# Patient Record
Sex: Female | Born: 1990 | Race: White | Hispanic: No | Marital: Married | Smoking: Former smoker
Health system: Southern US, Community
[De-identification: ages and names within clinical notes are randomized; demographics above are authoritative.]

## PROBLEM LIST (undated history)

## (undated) DIAGNOSIS — E039 Hypothyroidism, unspecified: Secondary | ICD-10-CM

## (undated) HISTORY — PX: WISDOM TOOTH EXTRACTION: SHX21

## (undated) HISTORY — PX: EYE SURGERY: SHX253

## (undated) HISTORY — PX: FRACTURE SURGERY: SHX138

---
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
## 2008-12-16 HISTORY — PX: WISDOM TOOTH EXTRACTION: SHX21

## 2009-07-09 ENCOUNTER — Emergency Department (HOSPITAL_COMMUNITY): Admission: EM | Admit: 2009-07-09 | Discharge: 2009-07-09 | Payer: Self-pay | Admitting: Family Medicine

## 2010-12-16 HISTORY — PX: EYE SURGERY: SHX253

## 2015-10-02 ENCOUNTER — Encounter: Payer: Self-pay | Admitting: Physician Assistant

## 2015-10-02 ENCOUNTER — Ambulatory Visit: Payer: Self-pay | Admitting: Physician Assistant

## 2015-10-02 VITALS — BP 132/65 | Temp 98.6°F | Wt 135.0 lb

## 2015-10-02 DIAGNOSIS — R21 Rash and other nonspecific skin eruption: Secondary | ICD-10-CM

## 2015-10-02 MED ORDER — CLOTRIMAZOLE-BETAMETHASONE 1-0.05 % EX CREA: TOPICAL_CREAM | CUTANEOUS | Status: DC

## 2015-10-02 MED ORDER — HYDROXYZINE HCL 50 MG PO TABS: 50.0000 mg | ORAL_TABLET | Freq: Three times a day (TID) | ORAL | Status: DC | PRN

## 2015-10-02 NOTE — Progress Notes (Signed)
  Rash  Subjective:    Patient ID: Alyssa Moore, female    DOB: Jan 10, 1991, 24 y.o.   MRN: [id number]  HPI Patient c/o rash bilateral axillary area for one day. Recent history of contact dermatitis to right arm from suspected sumac. Rash on arm resolved last week.  Patient denies any new hygiene products. States mild itching.   Review of Systems Negative except for compliant.     Objective:   Physical Exam Erythematous macular lesion bilateral axillary area.       Assessment & Plan:  Contact Dermatitis. Lotrisone and Atarax.  Follow up one week if no improvement.

## 2016-01-17 DIAGNOSIS — R87610 Atypical squamous cells of undetermined significance on cytologic smear of cervix (ASC-US): Secondary | ICD-10-CM | POA: Diagnosis not present

## 2016-01-17 DIAGNOSIS — Z01419 Encounter for gynecological examination (general) (routine) without abnormal findings: Secondary | ICD-10-CM | POA: Diagnosis not present

## 2016-12-29 DIAGNOSIS — J101 Influenza due to other identified influenza virus with other respiratory manifestations: Secondary | ICD-10-CM | POA: Diagnosis not present

## 2017-01-29 DIAGNOSIS — Z87898 Personal history of other specified conditions: Secondary | ICD-10-CM | POA: Diagnosis not present

## 2017-01-29 DIAGNOSIS — Z01419 Encounter for gynecological examination (general) (routine) without abnormal findings: Secondary | ICD-10-CM | POA: Diagnosis not present

## 2017-02-06 ENCOUNTER — Ambulatory Visit: Payer: Self-pay | Admitting: Physician Assistant

## 2017-02-06 VITALS — BP 108/60 | HR 59 | Temp 98.4°F

## 2017-02-06 DIAGNOSIS — L259 Unspecified contact dermatitis, unspecified cause: Secondary | ICD-10-CM

## 2017-02-06 MED ORDER — DEXAMETHASONE SODIUM PHOSPHATE 10 MG/ML IJ SOLN
10.0000 mg | Freq: Once | INTRAMUSCULAR | Status: AC
  Administered 2017-02-06: 10 mg via INTRAMUSCULAR

## 2017-02-06 NOTE — Progress Notes (Signed)
Decadron given IM right glut. Patient waited 15 min without any adverse reaction

## 2017-02-06 NOTE — Progress Notes (Signed)
S: c/o itchy rash on face,  States she gets it from [place] dogs, [place], usually gets it on her arms but this time its on her face, using calamine lotion  O: vitals wnl, nad, lungs c t a, cv rrr, skin with small raised red areas some with streaks/blisters on face, no drainage, n/v intact  A: acute contact dermatitis  P: decadron 10 mg im

## 2017-12-16 DIAGNOSIS — E039 Hypothyroidism, unspecified: Secondary | ICD-10-CM

## 2017-12-16 HISTORY — DX: Hypothyroidism, unspecified: E03.9

## 2018-02-24 DIAGNOSIS — Z3169 Encounter for other general counseling and advice on procreation: Secondary | ICD-10-CM | POA: Diagnosis not present

## 2018-02-24 DIAGNOSIS — E538 Deficiency of other specified B group vitamins: Secondary | ICD-10-CM | POA: Diagnosis not present

## 2018-02-24 DIAGNOSIS — Z01419 Encounter for gynecological examination (general) (routine) without abnormal findings: Secondary | ICD-10-CM | POA: Diagnosis not present

## 2018-02-24 DIAGNOSIS — Z0000 Encounter for general adult medical examination without abnormal findings: Secondary | ICD-10-CM | POA: Diagnosis not present

## 2018-02-24 DIAGNOSIS — R5383 Other fatigue: Secondary | ICD-10-CM | POA: Diagnosis not present

## 2018-02-27 DIAGNOSIS — R7989 Other specified abnormal findings of blood chemistry: Secondary | ICD-10-CM | POA: Insufficient documentation

## 2018-03-13 DIAGNOSIS — L578 Other skin changes due to chronic exposure to nonionizing radiation: Secondary | ICD-10-CM | POA: Diagnosis not present

## 2018-03-13 DIAGNOSIS — D485 Neoplasm of uncertain behavior of skin: Secondary | ICD-10-CM | POA: Diagnosis not present

## 2018-03-17 DIAGNOSIS — N912 Amenorrhea, unspecified: Secondary | ICD-10-CM | POA: Diagnosis not present

## 2018-03-17 DIAGNOSIS — E039 Hypothyroidism, unspecified: Secondary | ICD-10-CM | POA: Diagnosis not present

## 2018-04-01 DIAGNOSIS — E039 Hypothyroidism, unspecified: Secondary | ICD-10-CM | POA: Diagnosis not present

## 2018-04-07 DIAGNOSIS — D485 Neoplasm of uncertain behavior of skin: Secondary | ICD-10-CM | POA: Diagnosis not present

## 2018-04-07 DIAGNOSIS — D225 Melanocytic nevi of trunk: Secondary | ICD-10-CM | POA: Diagnosis not present

## 2018-04-14 DIAGNOSIS — Z3401 Encounter for supervision of normal first pregnancy, first trimester: Secondary | ICD-10-CM | POA: Diagnosis not present

## 2018-04-14 LAB — OB RESULTS CONSOLE VARICELLA ZOSTER ANTIBODY, IGG: Varicella: IMMUNE

## 2018-04-14 LAB — OB RESULTS CONSOLE HIV ANTIBODY (ROUTINE TESTING): HIV: NONREACTIVE

## 2018-04-14 LAB — OB RESULTS CONSOLE GC/CHLAMYDIA
Chlamydia: NEGATIVE
Gonorrhea: NEGATIVE

## 2018-04-14 LAB — OB RESULTS CONSOLE RUBELLA ANTIBODY, IGM: Rubella: IMMUNE

## 2018-04-14 LAB — OB RESULTS CONSOLE HEPATITIS B SURFACE ANTIGEN: Hepatitis B Surface Ag: NEGATIVE

## 2018-04-27 ENCOUNTER — Ambulatory Visit: Payer: Self-pay

## 2018-04-27 ENCOUNTER — Other Ambulatory Visit: Payer: Self-pay | Admitting: Certified Nurse Midwife

## 2018-04-27 DIAGNOSIS — Z369 Encounter for antenatal screening, unspecified: Secondary | ICD-10-CM

## 2018-04-30 ENCOUNTER — Other Ambulatory Visit: Payer: Self-pay | Admitting: *Deleted

## 2018-04-30 ENCOUNTER — Ambulatory Visit
Admission: RE | Admit: 2018-04-30 | Discharge: 2018-04-30 | Disposition: A | Discharge status: Home / Self Care | Payer: 59 | Source: Ambulatory Visit | Attending: Maternal & Fetal Medicine | Admitting: Maternal & Fetal Medicine

## 2018-04-30 ENCOUNTER — Encounter: Payer: Self-pay | Admitting: *Deleted

## 2018-04-30 ENCOUNTER — Ambulatory Visit
Admission: RE | Admit: 2018-04-30 | Discharge: 2018-04-30 | Disposition: A | Discharge status: Home / Self Care | Payer: 59 | Source: Ambulatory Visit | Attending: Certified Nurse Midwife | Admitting: Certified Nurse Midwife

## 2018-04-30 DIAGNOSIS — Z3682 Encounter for antenatal screening for nuchal translucency: Secondary | ICD-10-CM | POA: Diagnosis not present

## 2018-04-30 DIAGNOSIS — Z36 Encounter for antenatal screening for chromosomal anomalies: Secondary | ICD-10-CM | POA: Diagnosis not present

## 2018-04-30 DIAGNOSIS — Z369 Encounter for antenatal screening, unspecified: Secondary | ICD-10-CM

## 2018-04-30 HISTORY — DX: Hypothyroidism, unspecified: E03.9

## 2018-05-04 ENCOUNTER — Telehealth: Payer: Self-pay | Admitting: Obstetrics and Gynecology

## 2018-05-04 NOTE — Telephone Encounter (Signed)
  Ms. Cohea elected to undergo First Trimester screening on 04/30/2018.  To review, first trimester screening, includes nuchal translucency ultrasound screen and/or first trimester maternal serum marker screening.  The nuchal translucency has approximately an 80% detection rate for Down syndrome and can be positive for other chromosome abnormalities as well as heart defects.  When combined with a maternal serum marker screening, the detection rate is up to 90% for Down syndrome and up to 97% for trisomy 13 and 18.     The results of the First Trimester Nuchal Translucency and Biochemical Screening were within normal range.  The risk for Down syndrome is now estimated to be less than 1 in 10,000.  The risk for Trisomy 13/18 is also estimated to be less than 1 in 10,000.  Should more definitive information be desired, we would offer amniocentesis.  Because we do not yet know the effectiveness of combined first and second trimester screening, we do not recommend a maternal serum screen to assess the chance for chromosome conditions.  However, if screening for neural tube defects is desired, maternal serum screening for AFP only can be performed between 15 and [redacted] weeks gestation.    The patient declined CF and SMA carrier screening at her visit with [place].  Wilburt Finlay, MS, CGC

## 2018-05-05 DIAGNOSIS — E039 Hypothyroidism, unspecified: Secondary | ICD-10-CM | POA: Diagnosis not present

## 2018-05-26 ENCOUNTER — Other Ambulatory Visit: Payer: Self-pay | Admitting: Obstetrics and Gynecology

## 2018-05-26 DIAGNOSIS — Z3402 Encounter for supervision of normal first pregnancy, second trimester: Secondary | ICD-10-CM

## 2018-06-02 DIAGNOSIS — E039 Hypothyroidism, unspecified: Secondary | ICD-10-CM | POA: Diagnosis not present

## 2018-06-02 DIAGNOSIS — O99282 Endocrine, nutritional and metabolic diseases complicating pregnancy, second trimester: Secondary | ICD-10-CM | POA: Diagnosis not present

## 2018-06-02 DIAGNOSIS — E079 Disorder of thyroid, unspecified: Secondary | ICD-10-CM | POA: Diagnosis not present

## 2018-06-09 DIAGNOSIS — E039 Hypothyroidism, unspecified: Secondary | ICD-10-CM | POA: Insufficient documentation

## 2018-06-19 ENCOUNTER — Ambulatory Visit
Admission: RE | Admit: 2018-06-19 | Discharge: 2018-06-19 | Disposition: A | Discharge status: Home / Self Care | Payer: 59 | Source: Ambulatory Visit | Attending: Obstetrics and Gynecology | Admitting: Obstetrics and Gynecology

## 2018-06-19 DIAGNOSIS — Z3402 Encounter for supervision of normal first pregnancy, second trimester: Secondary | ICD-10-CM | POA: Diagnosis not present

## 2018-06-19 DIAGNOSIS — O358XX0 Maternal care for other (suspected) fetal abnormality and damage, not applicable or unspecified: Secondary | ICD-10-CM | POA: Diagnosis not present

## 2018-06-19 DIAGNOSIS — Z3A19 19 weeks gestation of pregnancy: Secondary | ICD-10-CM | POA: Diagnosis not present

## 2018-07-01 DIAGNOSIS — O99282 Endocrine, nutritional and metabolic diseases complicating pregnancy, second trimester: Secondary | ICD-10-CM | POA: Diagnosis not present

## 2018-07-01 DIAGNOSIS — E079 Disorder of thyroid, unspecified: Secondary | ICD-10-CM | POA: Diagnosis not present

## 2018-07-01 DIAGNOSIS — E039 Hypothyroidism, unspecified: Secondary | ICD-10-CM | POA: Diagnosis not present

## 2018-08-06 DIAGNOSIS — E079 Disorder of thyroid, unspecified: Secondary | ICD-10-CM | POA: Diagnosis not present

## 2018-08-06 DIAGNOSIS — O99282 Endocrine, nutritional and metabolic diseases complicating pregnancy, second trimester: Secondary | ICD-10-CM | POA: Diagnosis not present

## 2018-08-11 DIAGNOSIS — E039 Hypothyroidism, unspecified: Secondary | ICD-10-CM | POA: Diagnosis not present

## 2018-08-11 DIAGNOSIS — O99282 Endocrine, nutritional and metabolic diseases complicating pregnancy, second trimester: Secondary | ICD-10-CM | POA: Diagnosis not present

## 2018-08-11 DIAGNOSIS — E079 Disorder of thyroid, unspecified: Secondary | ICD-10-CM | POA: Diagnosis not present

## 2018-08-18 DIAGNOSIS — R030 Elevated blood-pressure reading, without diagnosis of hypertension: Secondary | ICD-10-CM | POA: Diagnosis not present

## 2018-08-18 DIAGNOSIS — O0992 Supervision of high risk pregnancy, unspecified, second trimester: Secondary | ICD-10-CM | POA: Diagnosis not present

## 2018-09-10 DIAGNOSIS — O99282 Endocrine, nutritional and metabolic diseases complicating pregnancy, second trimester: Secondary | ICD-10-CM | POA: Diagnosis not present

## 2018-09-10 DIAGNOSIS — E079 Disorder of thyroid, unspecified: Secondary | ICD-10-CM | POA: Diagnosis not present

## 2018-09-22 DIAGNOSIS — Z23 Encounter for immunization: Secondary | ICD-10-CM | POA: Diagnosis not present

## 2018-10-05 DIAGNOSIS — Z3483 Encounter for supervision of other normal pregnancy, third trimester: Secondary | ICD-10-CM | POA: Diagnosis not present

## 2018-10-05 DIAGNOSIS — Z3482 Encounter for supervision of other normal pregnancy, second trimester: Secondary | ICD-10-CM | POA: Diagnosis not present

## 2018-10-08 DIAGNOSIS — E079 Disorder of thyroid, unspecified: Secondary | ICD-10-CM | POA: Diagnosis not present

## 2018-10-08 DIAGNOSIS — O99282 Endocrine, nutritional and metabolic diseases complicating pregnancy, second trimester: Secondary | ICD-10-CM | POA: Diagnosis not present

## 2018-10-13 IMAGING — US US OB COMP +14 WK
1 series · 14 of 28 positions shown · non-contrast
Comparison: none

CLINICAL DATA: Normal pregnancy.  Fetal anatomy evaluation.

EXAM:
OBSTETRICAL ULTRASOUND >14 WKS

[Series 1: us ob comp +14 wk · 14 of 77 slices shown]
[im 3/77]
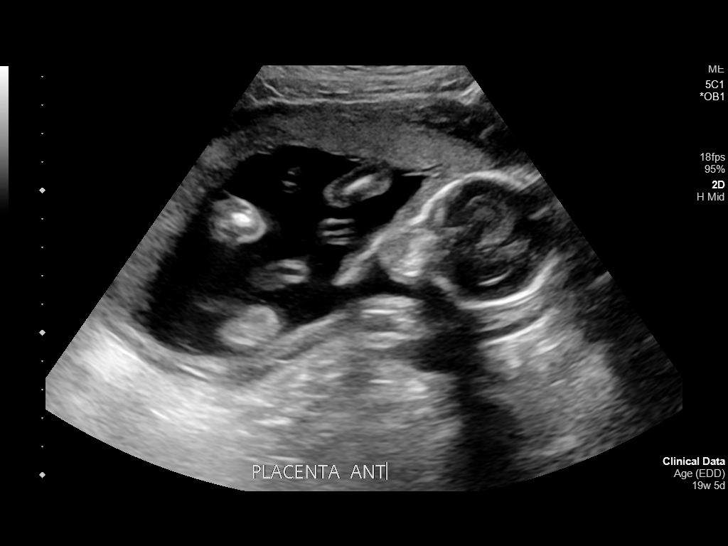
[im 9/77]
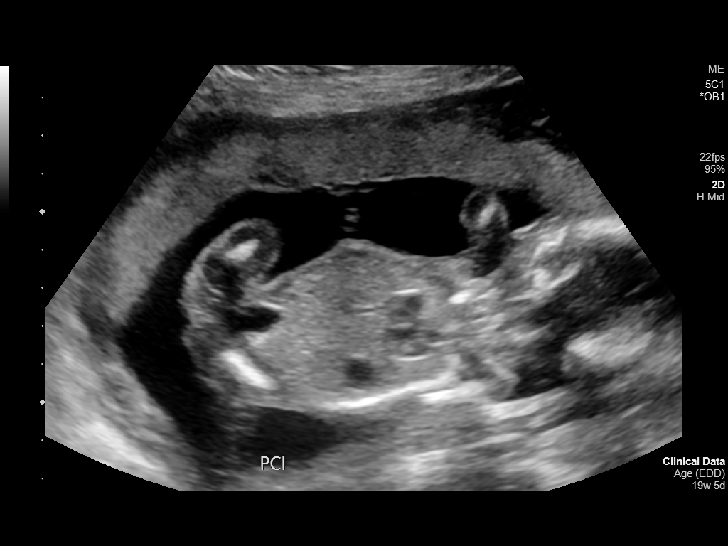
[im 15/77]
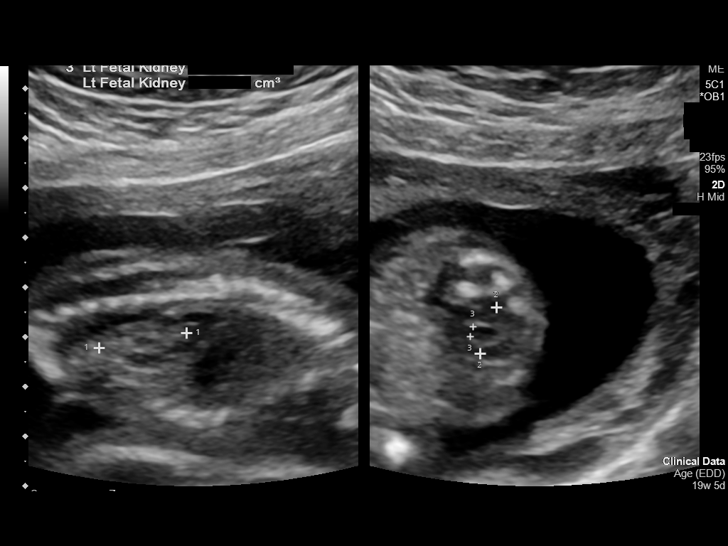
[im 20/77]
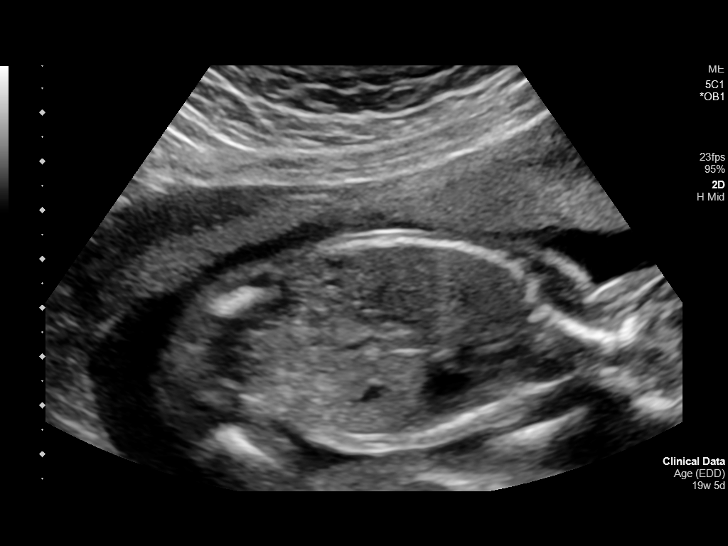
[im 26/77]
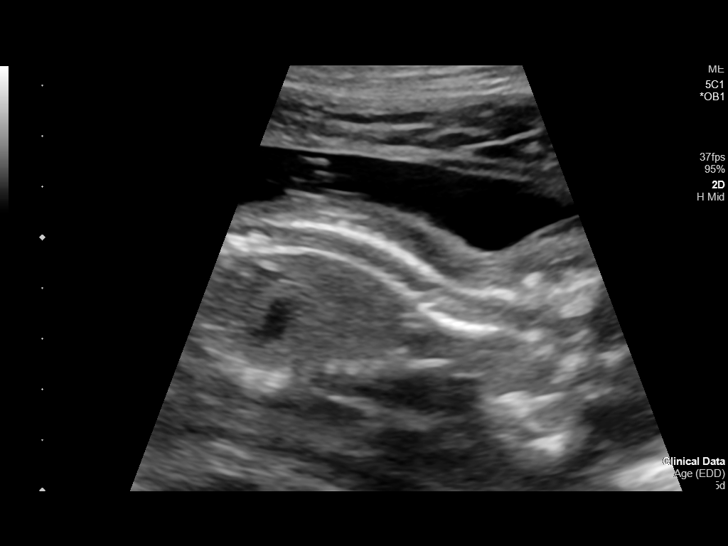
[im 31/77]
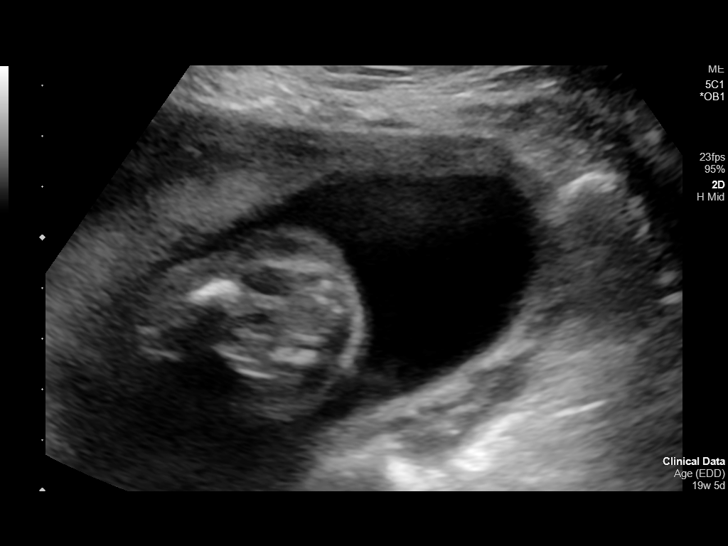
[im 37/77]
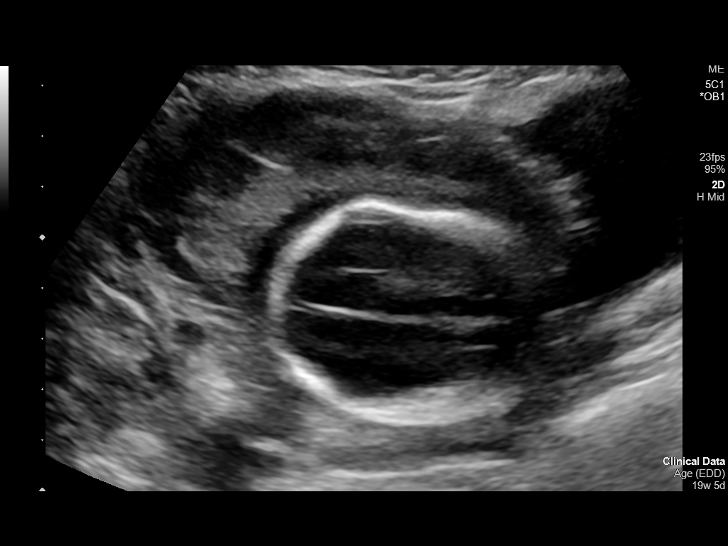
[im 43/77]
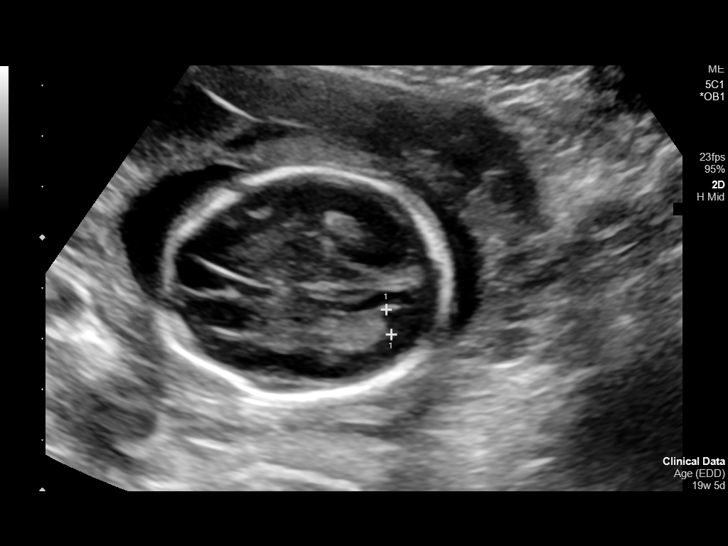
[im 48/77]
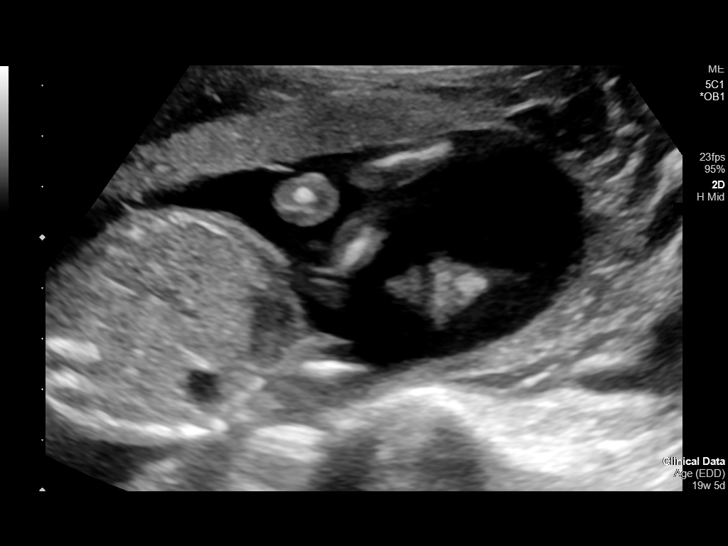
[im 54/77]
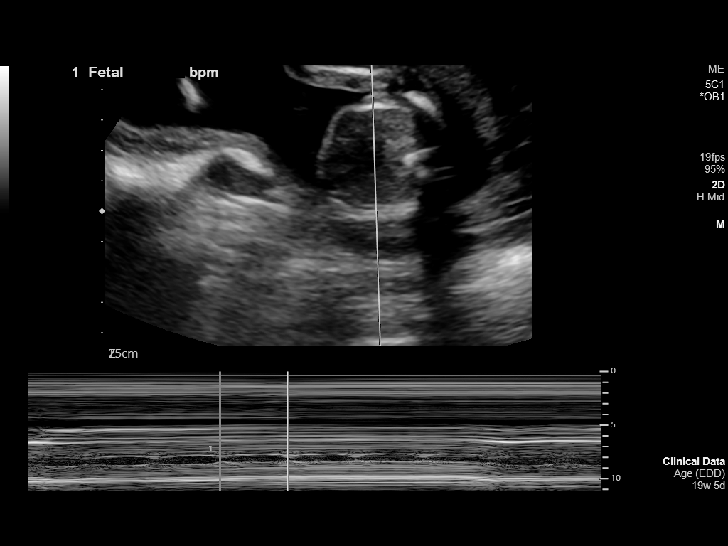
[im 60/77]
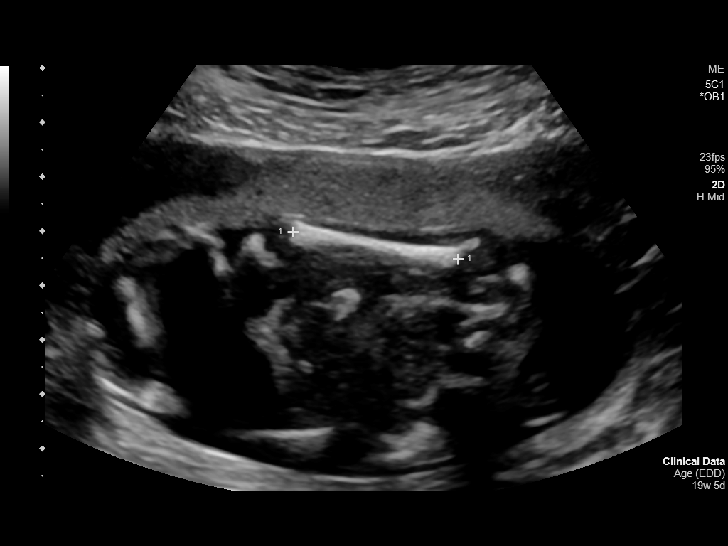
[im 65/77]
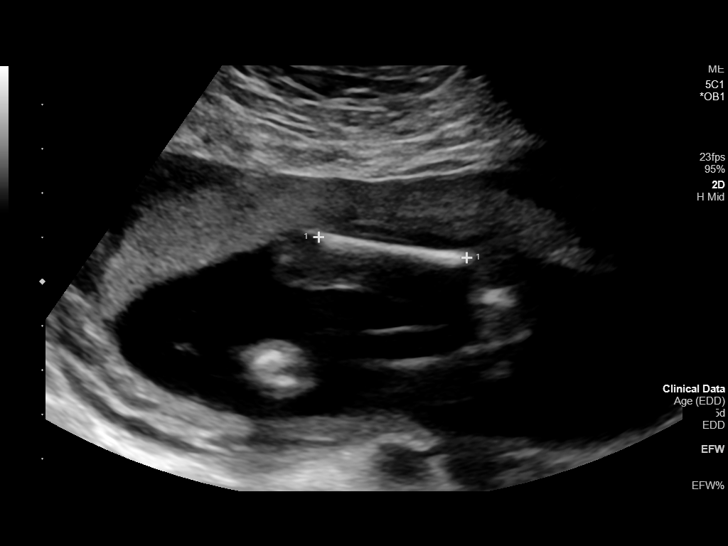
[im 71/77]
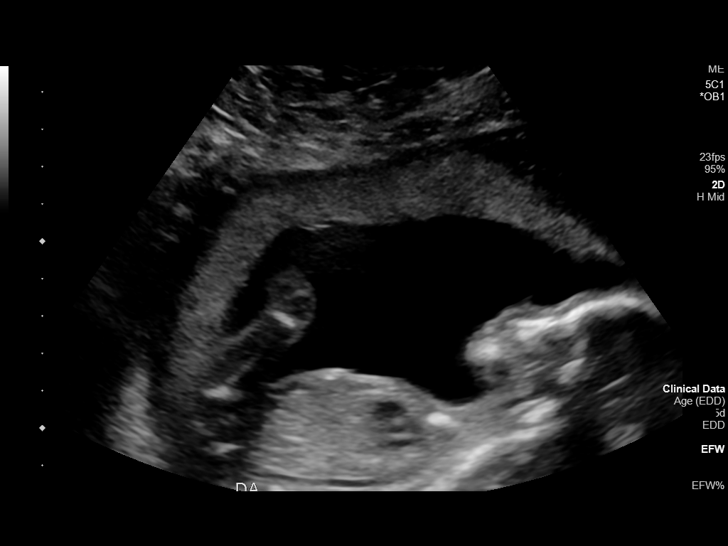
[im 77/77]
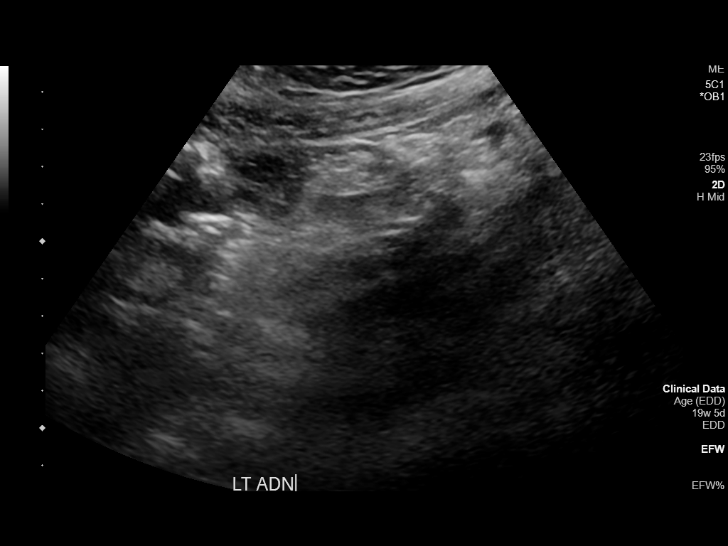

[14 of 28 positions shown; findings below may reference images not displayed]

FINDINGS: Number of Fetuses: 1

Heart Rate:  152 bpm

Movement: Yes

Presentation: Cephalic

Previa: No

Placental Location: Anterior

Amniotic Fluid (Subjective): Normal

Amniotic Fluid (Objective):

Vertical pocket 3.9cm

FETAL BIOMETRY

BPD: 4.46cm 19w 3d

HC: 16.34cm  19w   1d

AC: 14.63cm  19w   6d

FL: 3.32cm  20w   3d

Current Mean GA: 19w 5d              US EDC: 11/08/2018

FETAL ANATOMY

Lateral Ventricles: Appears normal

Thalami/CSP: Appears normal

Posterior Fossa:  Appears normal

Nuchal Region: Appears normal

Upper Lip: Appears normal

Spine: Appears normal

4 Chamber Heart on Left: Appears normal

LVOT: Appears normal

RVOT: Appears normal

Stomach on Left: Appears normal

3 Vessel Cord: Appears normal

Cord Insertion site: Appears normal

Kidneys: Appears normal

Bladder: Appears normal

Extremities: Appears normal

Technically difficult due to: None

Maternal Findings:

Cervix:  Cervix is normal in length and closed measuring 3.8 cm.
IMPRESSION: Single live intrauterine pregnancy as detailed above.

## 2018-10-14 DIAGNOSIS — O0993 Supervision of high risk pregnancy, unspecified, third trimester: Secondary | ICD-10-CM | POA: Diagnosis not present

## 2018-10-14 LAB — OB RESULTS CONSOLE GBS: GBS: NEGATIVE

## 2018-10-16 DIAGNOSIS — E039 Hypothyroidism, unspecified: Secondary | ICD-10-CM | POA: Diagnosis not present

## 2018-10-16 DIAGNOSIS — O99283 Endocrine, nutritional and metabolic diseases complicating pregnancy, third trimester: Secondary | ICD-10-CM | POA: Diagnosis not present

## 2018-10-19 ENCOUNTER — Observation Stay
Admission: EM | Admit: 2018-10-19 | Discharge: 2018-10-19 | Disposition: A | Discharge status: Home / Self Care | Payer: 59 | Attending: Obstetrics & Gynecology | Admitting: Obstetrics & Gynecology

## 2018-10-19 ENCOUNTER — Other Ambulatory Visit: Payer: Self-pay | Admitting: Obstetrics & Gynecology

## 2018-10-19 ENCOUNTER — Other Ambulatory Visit: Payer: Self-pay

## 2018-10-19 DIAGNOSIS — O321XX0 Maternal care for breech presentation, not applicable or unspecified: Secondary | ICD-10-CM | POA: Diagnosis not present

## 2018-10-19 DIAGNOSIS — Z79890 Hormone replacement therapy (postmenopausal): Secondary | ICD-10-CM | POA: Diagnosis not present

## 2018-10-19 DIAGNOSIS — O99283 Endocrine, nutritional and metabolic diseases complicating pregnancy, third trimester: Secondary | ICD-10-CM | POA: Diagnosis not present

## 2018-10-19 DIAGNOSIS — Z3A37 37 weeks gestation of pregnancy: Secondary | ICD-10-CM | POA: Diagnosis not present

## 2018-10-19 DIAGNOSIS — Z87891 Personal history of nicotine dependence: Secondary | ICD-10-CM | POA: Diagnosis not present

## 2018-10-19 DIAGNOSIS — Z3493 Encounter for supervision of normal pregnancy, unspecified, third trimester: Secondary | ICD-10-CM

## 2018-10-19 DIAGNOSIS — O329XX0 Maternal care for malpresentation of fetus, unspecified, not applicable or unspecified: Secondary | ICD-10-CM | POA: Diagnosis present

## 2018-10-19 DIAGNOSIS — O368330 Maternal care for abnormalities of the fetal heart rate or rhythm, third trimester, not applicable or unspecified: Secondary | ICD-10-CM | POA: Diagnosis not present

## 2018-10-19 LAB — TYPE AND SCREEN
ABO/RH(D): O POS
Antibody Screen: NEGATIVE

## 2018-10-19 LAB — OB RESULTS CONSOLE GBS: GBS: NEGATIVE

## 2018-10-19 LAB — CBC
HCT: 35.3 % — ABNORMAL LOW (ref 36.0–46.0)
Hemoglobin: 11.6 g/dL — ABNORMAL LOW (ref 12.0–15.0)
MCH: 31.6 pg (ref 26.0–34.0)
MCHC: 32.9 g/dL (ref 30.0–36.0)
MCV: 96.2 fL (ref 80.0–100.0)
NRBC: 0 % (ref 0.0–0.2)
PLATELETS: 201 10*3/uL (ref 150–400)
RBC: 3.67 MIL/uL — AB (ref 3.87–5.11)
RDW: 13.2 % (ref 11.5–15.5)
WBC: 10.8 10*3/uL — ABNORMAL HIGH (ref 4.0–10.5)

## 2018-10-19 MED ORDER — LIDOCAINE HCL (PF) 1 % IJ SOLN: 30.0000 mL | INTRAMUSCULAR | Status: DC | PRN

## 2018-10-19 MED ORDER — TERBUTALINE SULFATE 1 MG/ML IJ SOLN
0.2500 mg | Freq: Once | INTRAMUSCULAR | Status: AC
  Administered 2018-10-19: 0.25 mg via SUBCUTANEOUS
  Filled 2018-10-19: qty 1

## 2018-10-19 MED ORDER — SOD CITRATE-CITRIC ACID 500-334 MG/5ML PO SOLN: 30.0000 mL | ORAL | Status: DC | PRN

## 2018-10-19 MED ORDER — LACTATED RINGERS IV SOLN
INTRAVENOUS | Status: DC
  Administered 2018-10-19: 10:00:00 via INTRAVENOUS

## 2018-10-19 MED ORDER — FENTANYL CITRATE (PF) 100 MCG/2ML IJ SOLN
50.0000 ug | INTRAMUSCULAR | Status: DC | PRN
  Administered 2018-10-19: 100 ug via INTRAVENOUS
  Filled 2018-10-19: qty 2

## 2018-10-19 MED ORDER — ONDANSETRON HCL 4 MG/2ML IJ SOLN: 4.0000 mg | Freq: Four times a day (QID) | INTRAMUSCULAR | Status: DC | PRN

## 2018-10-19 MED ORDER — LACTATED RINGERS IV SOLN: 500.0000 mL | INTRAVENOUS | Status: DC | PRN

## 2018-10-19 MED ORDER — ACETAMINOPHEN 500 MG PO TABS: 1000.0000 mg | ORAL_TABLET | Freq: Four times a day (QID) | ORAL | Status: DC | PRN

## 2018-10-19 MED ORDER — MINERAL OIL LIGHT 100 % EX OIL
TOPICAL_OIL | Freq: Once | CUTANEOUS | Status: AC
  Administered 2018-10-19: 1 via TOPICAL
  Filled 2018-10-19: qty 25

## 2018-10-19 NOTE — Discharge Summary (Signed)
Alyssa Moore is a 27 y.o. female. She is at [redacted]w[redacted]d gestation. Patient's last menstrual period was 02/01/2018 (exact date). Estimated Date of Delivery: 11/08/18  Prenatal care site: [facility]   Chief complaint: breech  S: Resting comfortably. no CTX, no VB.no LOF,  Active fetal movement.   Alyssa Moore is here for attempt at external cephalic version due to breech position of the fetus.   Maternal Medical History:   Past Medical History:  Diagnosis Date  . Hypothyroidism     Past Surgical History:  Procedure Laterality Date  . FRACTURE SURGERY      No Known Allergies  Prior to Admission medications   Medication Sig Start Date End Date Taking? Authorizing Provider  levothyroxine (SYNTHROID, LEVOTHROID) 88 MCG tablet Take 88 mcg by mouth daily before breakfast.   Yes [provider]  Prenatal Vit-Fe Fumarate-FA (PRENATAL MULTIVITAMIN) TABS tablet Take 1 tablet by mouth daily at 12 noon.   Yes [provider]  norgestimate-ethinyl estradiol (ORTHO-CYCLEN,SPRINTEC,PREVIFEM) 0.25-35 MG-MCG tablet Take 1 tablet by mouth daily.    [provider]     Social History: She  reports that she has quit smoking. She has never used smokeless tobacco. She reports that she drank alcohol. She reports that she does not use drugs.  Family History: no history of gyn cancers  Review of Systems: A full review of systems was performed and negative except as noted in the HPI.     O:  BP 121/82 (BP Location: Right Arm)   Pulse 85   Temp 98.1 F (36.7 C) (Oral)   Resp 18   Ht 5\' 3"  (1.6 m)   Wt 77.6 kg   LMP 02/01/2018 (Exact Date)   SpO2 100%   BMI 30.29 kg/m  Results for orders placed or performed during the hospital encounter of 10/19/18 (from the past 48 hour(s))  CBC   Collection Time: 10/19/18  9:38 AM  Result Value Ref Range   WBC 10.8 (H) 4.0 - 10.5 K/uL   RBC 3.67 (L) 3.87 - 5.11 MIL/uL   Hemoglobin 11.6 (L) 12.0 - 15.0 g/dL   HCT 35.3 (L)  36.0 - 46.0 %   MCV 96.2 80.0 - 100.0 fL   MCH 31.6 26.0 - 34.0 pg   MCHC 32.9 30.0 - 36.0 g/dL   RDW 13.2 11.5 - 15.5 %   Platelets 201 150 - 400 K/uL   nRBC 0.0 0.0 - 0.2 %  Type and screen   Collection Time: 10/19/18  9:38 AM  Result Value Ref Range   ABO/RH(D) O POS    Antibody Screen NEG    Sample Expiration      10/22/2018 Performed at [facility], [address] [place], [place] [address]      Constitutional: NAD, AAOx3  HE/ENT: extraocular movements grossly intact, moist mucous membranes CV: RRR PULM: nl respiratory effort, CTABL     Abd: gravid, non-tender, non-distended, soft      Ext: Non-tender, Nonedmeatous   Psych: mood appropriate, speech normal Pelvic deferred  NST:  Baseline: 140 Variability: moderate Accelerations present x >2 Decelerations absent Time 26mins  Ultrasound:   Fetal head midline with spine maternal left, coccyx at pelvic inlet.  Ample fluid. During version, fetal HR decelerated to 60s and then returned to baseline with observation.    Assessment: 27 y.o. [redacted]w[redacted]d here for antenatal surveillance during pregnancy.  Principle diagnosis: malpresentation of fetus, third trimester  Plan:  External cephalic version unsuccessful  Fetal Wellbeing:  Reassuring Cat 1 tracing.  Reactive NST   D/c home stable, precautions reviewed, follow-up as scheduled.   ----- Larey Days, MD Attending Obstetrician and Gynecologist [facility], Department of [facility]

## 2018-10-19 NOTE — Discharge Summary (Signed)
RN reviewed discharge instructions with patient. Gave patient opportunity for questions. All questions answered at this time. Pt verbalized understanding. Pt discharged home with her spouse and mother.

## 2018-10-19 NOTE — Progress Notes (Signed)
MDs at bedside performing version on patient currently. Will continue to monitor

## 2018-10-20 LAB — RPR: RPR Ser Ql: NONREACTIVE

## 2018-10-20 NOTE — Procedures (Signed)
Patient is 37+1 weeks with Breech presentation. She was counseled of attempt for external cephalic version vs. Cesarean.  Risks of failure, abruption, rupture of membranes, and fetal distress were discussed, and patient agreed for ECV.  Preop: breech presentation, term IUP Postop: breech presentation, failed ECV, term IUP  Findings:  Breech fetal lie, with fetal head maternal midline, spine maternal left, placenta maternal right Post-procedure: unchanged, normal cardiac rate and rhythm  External cephalic version attempted with mineral oil.22mcg of terbutaline given IM and60mcgof iv fentanylgivenat the beginningthe procedure. Fetal heart rate documented in the normal baseline range throughout procedure. Bedside ultrasound confirmedfetal head unchanged at the end of the procedure.  After the procedure, external fetal monitors were placed and confirmed a normal baseline heart rate with moderate variability, recovered after a brief period of bradycardia.   She will be scheduled for a cesarean on 11/03/18 at 39 weeks.  If labor progresses prior to this date, she will be delivered at that time.   ----- Larey Days, MD Attending Obstetrician and Gynecologist [facility], Department of [facility]

## 2018-11-02 ENCOUNTER — Other Ambulatory Visit: Payer: Self-pay

## 2018-11-02 ENCOUNTER — Encounter
Admission: RE | Admit: 2018-11-02 | Discharge: 2018-11-02 | Disposition: A | Discharge status: Home / Self Care | Payer: 59 | Source: Ambulatory Visit | Attending: Obstetrics & Gynecology | Admitting: Obstetrics & Gynecology

## 2018-11-02 DIAGNOSIS — Z01812 Encounter for preprocedural laboratory examination: Secondary | ICD-10-CM

## 2018-11-02 LAB — TYPE AND SCREEN
ABO/RH(D): O POS
Antibody Screen: NEGATIVE
EXTEND SAMPLE REASON: UNDETERMINED

## 2018-11-02 NOTE — Patient Instructions (Signed)
Your procedure is scheduled on: Tuesday November 03, 2018  Report to [place] AT 11:30AM   REMEMBER: Instructions that are not followed completely may result in serious medical risk, up to and including death; or upon the discretion of your surgeon and anesthesiologist your surgery may need to be rescheduled.  Do not eat food after midnight the night before surgery.  No gum chewing, lozengers or hard candies.  You may however, drink CLEAR liquids up to 2 hours before you are scheduled to arrive for your surgery. Do not drink anything within 2 hours of the start of your surgery.  Clear liquids include: - water  - apple juice without pulp - CLEAR gatorade - black coffee or tea (Do NOT add milk or creamers to the coffee or tea) Do NOT drink anything that is not on this list.  Type 1 and Type 2 diabetics should only drink water.  No Alcohol for 24 hours before or after surgery.  No Smoking including e-cigarettes for 24 hours prior to surgery.  No chewable tobacco products for at least 6 hours prior to surgery.  No nicotine patches on the day of surgery.  On the morning of surgery brush your teeth with toothpaste and water, you may rinse your mouth with mouthwash if you wish. Do not swallow any toothpaste or mouthwash.  Notify your doctor if there is any change in your medical condition (cold, fever, infection).  Do not wear jewelry, make-up, hairpins, clips or nail polish.  Do not wear lotions, powders, or perfumes.   Do not shave 48 hours prior to surgery.   Contacts and dentures may not be worn into surgery.  Do not bring valuables to the hospital, including drivers license, insurance or credit cards.   is not responsible for any belongings or valuables.   TAKE THESE MEDICATIONS THE MORNING OF SURGERY: SYNTHROID  Use CHG  wipes as directed on instruction sheet.  Stop Anti-inflammatories (NSAIDS) such as Advil, Aleve, Ibuprofen, Motrin, Naproxen,  Naprosyn and Aspirin based products such as Excedrin, Goodys Powder, BC Powder. (May take Tylenol or Acetaminophen if needed.)  Stop ANY OVER THE COUNTER supplements until after surgery. (May continue Vitamin D, Vitamin B, and multivitamin.)  Wear comfortable clothing (specific to your surgery type) to the hospital.  Plan for stool softeners for home use.  If you are being admitted to the hospital overnight, leave your suitcase in the car. After surgery it may be brought to your room.  If you are being discharged the day of surgery, you will not be allowed to drive home. You will need a responsible adult to drive you home and stay with you that night.   If you are taking public transportation, you will need to have a responsible adult with you. Please confirm with your physician that it is acceptable to use public transportation.   Please call [phone] if you have any questions about these instructions.

## 2018-11-03 ENCOUNTER — Encounter
Admission: EM | Disposition: A | Discharge status: Home / Self Care | Payer: Self-pay | Source: Home / Self Care | Attending: Obstetrics & Gynecology

## 2018-11-03 ENCOUNTER — Other Ambulatory Visit: Payer: Self-pay

## 2018-11-03 ENCOUNTER — Inpatient Hospital Stay: Payer: 59 | Admitting: Anesthesiology

## 2018-11-03 ENCOUNTER — Inpatient Hospital Stay
Admission: EM | Admit: 2018-11-03 | Discharge: 2018-11-05 | DRG: 787 | Disposition: A | Discharge status: Home / Self Care | Payer: 59 | Attending: Obstetrics & Gynecology | Admitting: Obstetrics & Gynecology

## 2018-11-03 ENCOUNTER — Inpatient Hospital Stay: Admission: RE | Admit: 2018-11-03 | Payer: 59 | Source: Ambulatory Visit | Admitting: Obstetrics & Gynecology

## 2018-11-03 DIAGNOSIS — E039 Hypothyroidism, unspecified: Secondary | ICD-10-CM | POA: Diagnosis present

## 2018-11-03 DIAGNOSIS — O329XX0 Maternal care for malpresentation of fetus, unspecified, not applicable or unspecified: Secondary | ICD-10-CM | POA: Diagnosis present

## 2018-11-03 DIAGNOSIS — O99284 Endocrine, nutritional and metabolic diseases complicating childbirth: Secondary | ICD-10-CM | POA: Diagnosis present

## 2018-11-03 DIAGNOSIS — O321XX0 Maternal care for breech presentation, not applicable or unspecified: Secondary | ICD-10-CM | POA: Diagnosis not present

## 2018-11-03 DIAGNOSIS — Z3A39 39 weeks gestation of pregnancy: Secondary | ICD-10-CM | POA: Diagnosis not present

## 2018-11-03 DIAGNOSIS — Z87891 Personal history of nicotine dependence: Secondary | ICD-10-CM | POA: Diagnosis not present

## 2018-11-03 DIAGNOSIS — O9081 Anemia of the puerperium: Secondary | ICD-10-CM | POA: Diagnosis not present

## 2018-11-03 DIAGNOSIS — D62 Acute posthemorrhagic anemia: Secondary | ICD-10-CM | POA: Diagnosis not present

## 2018-11-03 LAB — CBC
HCT: 34.8 % — ABNORMAL LOW (ref 36.0–46.0)
HEMOGLOBIN: 11.6 g/dL — AB (ref 12.0–15.0)
MCH: 32 pg (ref 26.0–34.0)
MCHC: 33.3 g/dL (ref 30.0–36.0)
MCV: 96.1 fL (ref 80.0–100.0)
Platelets: 209 10*3/uL (ref 150–400)
RBC: 3.62 MIL/uL — AB (ref 3.87–5.11)
RDW: 13.5 % (ref 11.5–15.5)
WBC: 10.8 10*3/uL — AB (ref 4.0–10.5)
nRBC: 0 % (ref 0.0–0.2)

## 2018-11-03 SURGERY — Surgical Case
Anesthesia: Spinal

## 2018-11-03 MED ORDER — DIPHENHYDRAMINE HCL 25 MG PO CAPS: 25.0000 mg | ORAL_CAPSULE | ORAL | Status: DC | PRN

## 2018-11-03 MED ORDER — SODIUM CHLORIDE 0.9% FLUSH: 3.0000 mL | Freq: Two times a day (BID) | INTRAVENOUS | Status: DC

## 2018-11-03 MED ORDER — NALBUPHINE HCL 10 MG/ML IJ SOLN: 5.0000 mg | INTRAMUSCULAR | Status: DC | PRN

## 2018-11-03 MED ORDER — PRENATAL MULTIVITAMIN CH
1.0000 | ORAL_TABLET | Freq: Every day | ORAL | Status: DC
  Administered 2018-11-04: 1 via ORAL
  Filled 2018-11-03: qty 1

## 2018-11-03 MED ORDER — LEVOTHYROXINE SODIUM 88 MCG PO TABS
88.0000 ug | ORAL_TABLET | ORAL | Status: DC
  Administered 2018-11-04 – 2018-11-05 (×2): 88 ug via ORAL
  Filled 2018-11-03: qty 1
  Filled 2018-11-03: qty 2

## 2018-11-03 MED ORDER — BUPIVACAINE IN DEXTROSE 0.75-8.25 % IT SOLN
INTRATHECAL | Status: DC | PRN
  Administered 2018-11-03: 1.6 mL via INTRATHECAL

## 2018-11-03 MED ORDER — OXYTOCIN 40 UNITS IN LACTATED RINGERS INFUSION - SIMPLE MED
INTRAVENOUS | Status: AC
  Filled 2018-11-03: qty 1000

## 2018-11-03 MED ORDER — SODIUM CHLORIDE 0.9 % IV SOLN: 250.0000 mL | INTRAVENOUS | Status: DC

## 2018-11-03 MED ORDER — SIMETHICONE 80 MG PO CHEW
160.0000 mg | CHEWABLE_TABLET | Freq: Four times a day (QID) | ORAL | Status: DC | PRN
  Administered 2018-11-04 – 2018-11-05 (×5): 160 mg via ORAL
  Filled 2018-11-03 (×6): qty 2

## 2018-11-03 MED ORDER — SODIUM CHLORIDE 0.9% FLUSH: 3.0000 mL | INTRAVENOUS | Status: DC | PRN

## 2018-11-03 MED ORDER — DIBUCAINE 1 % RE OINT: 1.0000 "application " | TOPICAL_OINTMENT | RECTAL | Status: DC | PRN

## 2018-11-03 MED ORDER — OXYTOCIN 40 UNITS IN LACTATED RINGERS INFUSION - SIMPLE MED
INTRAVENOUS | Status: DC | PRN
  Administered 2018-11-03: 600 mL via INTRAVENOUS
  Administered 2018-11-03: 62.5 mL/h

## 2018-11-03 MED ORDER — NALBUPHINE HCL 10 MG/ML IJ SOLN: 5.0000 mg | Freq: Once | INTRAMUSCULAR | Status: DC | PRN

## 2018-11-03 MED ORDER — FENTANYL CITRATE (PF) 100 MCG/2ML IJ SOLN
INTRAMUSCULAR | Status: DC | PRN
  Administered 2018-11-03: 15 ug via INTRATHECAL

## 2018-11-03 MED ORDER — FAMOTIDINE 20 MG PO TABS
20.0000 mg | ORAL_TABLET | Freq: Once | ORAL | Status: AC
  Administered 2018-11-03: 20 mg via ORAL
  Filled 2018-11-03: qty 1

## 2018-11-03 MED ORDER — WITCH HAZEL-GLYCERIN EX PADS: 1.0000 "application " | MEDICATED_PAD | CUTANEOUS | Status: DC | PRN

## 2018-11-03 MED ORDER — COCONUT OIL OIL
1.0000 "application " | TOPICAL_OIL | Status: DC | PRN
  Administered 2018-11-03: 1 via TOPICAL
  Filled 2018-11-03: qty 120

## 2018-11-03 MED ORDER — ONDANSETRON HCL 4 MG/2ML IJ SOLN
INTRAMUSCULAR | Status: DC | PRN
  Administered 2018-11-03: 4 mg via INTRAVENOUS

## 2018-11-03 MED ORDER — LACTATED RINGERS IV SOLN
INTRAVENOUS | Status: DC
  Administered 2018-11-03: 1000 mL via INTRAVENOUS
  Administered 2018-11-03: 13:00:00 via INTRAVENOUS

## 2018-11-03 MED ORDER — NALOXONE HCL 0.4 MG/ML IJ SOLN: 0.4000 mg | INTRAMUSCULAR | Status: DC | PRN

## 2018-11-03 MED ORDER — BUPIVACAINE HCL (PF) 0.5 % IJ SOLN
30.0000 mL | Freq: Once | INTRAMUSCULAR | Status: DC
  Filled 2018-11-03: qty 30

## 2018-11-03 MED ORDER — CEFAZOLIN SODIUM-DEXTROSE 2-4 GM/100ML-% IV SOLN
2.0000 g | INTRAVENOUS | Status: AC
  Administered 2018-11-03: 2 g via INTRAVENOUS
  Filled 2018-11-03: qty 100

## 2018-11-03 MED ORDER — KETOROLAC TROMETHAMINE 30 MG/ML IJ SOLN: 30.0000 mg | Freq: Four times a day (QID) | INTRAMUSCULAR | Status: DC

## 2018-11-03 MED ORDER — MENTHOL 3 MG MT LOZG
1.0000 | LOZENGE | OROMUCOSAL | Status: DC | PRN
  Filled 2018-11-03: qty 9

## 2018-11-03 MED ORDER — SODIUM CHLORIDE (PF) 0.9 % IJ SOLN
INTRAMUSCULAR | Status: AC
  Filled 2018-11-03: qty 50

## 2018-11-03 MED ORDER — KETOROLAC TROMETHAMINE 30 MG/ML IJ SOLN
30.0000 mg | Freq: Once | INTRAMUSCULAR | Status: AC
  Administered 2018-11-03: 30 mg via INTRAVENOUS
  Filled 2018-11-03: qty 1

## 2018-11-03 MED ORDER — ACETAMINOPHEN 325 MG PO TABS: 650.0000 mg | ORAL_TABLET | Freq: Four times a day (QID) | ORAL | Status: DC

## 2018-11-03 MED ORDER — MORPHINE SULFATE (PF) 0.5 MG/ML IJ SOLN
INTRAMUSCULAR | Status: DC | PRN
  Administered 2018-11-03: .1 mg via INTRATHECAL

## 2018-11-03 MED ORDER — OXYCODONE HCL 5 MG PO TABS: 10.0000 mg | ORAL_TABLET | ORAL | Status: DC | PRN

## 2018-11-03 MED ORDER — OXYCODONE HCL 5 MG PO TABS
5.0000 mg | ORAL_TABLET | ORAL | Status: DC | PRN
  Administered 2018-11-04 (×2): 5 mg via ORAL
  Filled 2018-11-03 (×2): qty 1

## 2018-11-03 MED ORDER — BUPIVACAINE LIPOSOME 1.3 % IJ SUSP
20.0000 mL | Freq: Once | INTRAMUSCULAR | Status: DC
  Filled 2018-11-03: qty 20

## 2018-11-03 MED ORDER — SENNOSIDES-DOCUSATE SODIUM 8.6-50 MG PO TABS
2.0000 | ORAL_TABLET | ORAL | Status: DC
  Administered 2018-11-04 – 2018-11-05 (×2): 2 via ORAL
  Filled 2018-11-03 (×3): qty 2

## 2018-11-03 MED ORDER — ACETAMINOPHEN 500 MG PO TABS
1000.0000 mg | ORAL_TABLET | Freq: Four times a day (QID) | ORAL | Status: DC
  Administered 2018-11-03 – 2018-11-05 (×7): 1000 mg via ORAL
  Filled 2018-11-03 (×7): qty 2

## 2018-11-03 MED ORDER — LACTATED RINGERS IV BOLUS
1000.0000 mL | Freq: Once | INTRAVENOUS | Status: AC
  Administered 2018-11-03: 1000 mL via INTRAVENOUS

## 2018-11-03 MED ORDER — IBUPROFEN 600 MG PO TABS
600.0000 mg | ORAL_TABLET | Freq: Four times a day (QID) | ORAL | Status: DC
  Administered 2018-11-04 – 2018-11-05 (×6): 600 mg via ORAL
  Filled 2018-11-03 (×6): qty 1

## 2018-11-03 MED ORDER — SOD CITRATE-CITRIC ACID 500-334 MG/5ML PO SOLN
30.0000 mL | ORAL | Status: AC
  Administered 2018-11-03: 30 mL via ORAL
  Filled 2018-11-03: qty 15

## 2018-11-03 MED ORDER — DIPHENHYDRAMINE HCL 25 MG PO CAPS: 25.0000 mg | ORAL_CAPSULE | Freq: Four times a day (QID) | ORAL | Status: DC | PRN

## 2018-11-03 MED ORDER — BUPIVACAINE LIPOSOME 1.3 % IJ SUSP
INTRAMUSCULAR | Status: DC | PRN
  Administered 2018-11-03: 50 mL

## 2018-11-03 MED ORDER — OXYCODONE HCL 5 MG PO TABS: 5.0000 mg | ORAL_TABLET | ORAL | Status: DC | PRN

## 2018-11-03 MED ORDER — ACETAMINOPHEN 650 MG RE SUPP
650.0000 mg | Freq: Once | RECTAL | Status: DC
  Filled 2018-11-03: qty 1

## 2018-11-03 MED ORDER — LACTATED RINGERS IV SOLN: INTRAVENOUS | Status: DC

## 2018-11-03 MED ORDER — OXYTOCIN 40 UNITS IN LACTATED RINGERS INFUSION - SIMPLE MED
2.5000 [IU]/h | INTRAVENOUS | Status: DC
  Administered 2018-11-03: 2.5 [IU]/h via INTRAVENOUS
  Filled 2018-11-03: qty 1000

## 2018-11-03 MED ORDER — SODIUM CHLORIDE (PF) 0.9 % IJ SOLN
INTRAMUSCULAR | Status: DC | PRN
  Administered 2018-11-03: 50 mL via INTRAVENOUS

## 2018-11-03 MED ORDER — DIPHENHYDRAMINE HCL 50 MG/ML IJ SOLN: 12.5000 mg | INTRAMUSCULAR | Status: DC | PRN

## 2018-11-03 MED ORDER — MEPERIDINE HCL 50 MG/ML IJ SOLN: 6.2500 mg | INTRAMUSCULAR | Status: DC | PRN

## 2018-11-03 MED ORDER — SODIUM CHLORIDE 0.9 % IV SOLN
INTRAVENOUS | Status: DC | PRN
  Administered 2018-11-03: 50 ug/min via INTRAVENOUS

## 2018-11-03 MED ORDER — ONDANSETRON HCL 4 MG/2ML IJ SOLN
4.0000 mg | Freq: Three times a day (TID) | INTRAMUSCULAR | Status: DC | PRN
  Filled 2018-11-03: qty 2

## 2018-11-03 SURGICAL SUPPLY — 30 items
CANISTER SUCT 3000ML PPV (MISCELLANEOUS) ×2 IMPLANT
COVER WAND RF STERILE (DRAPES) ×2 IMPLANT
DERMABOND ADVANCED (GAUZE/BANDAGES/DRESSINGS) ×1
DERMABOND ADVANCED .7 DNX12 (GAUZE/BANDAGES/DRESSINGS) ×1 IMPLANT
DRSG TELFA 3X8 NADH (GAUZE/BANDAGES/DRESSINGS) ×2 IMPLANT
ELECT CAUTERY BLADE 6.4 (BLADE) ×2 IMPLANT
ELECT REM PT RETURN 9FT ADLT (ELECTROSURGICAL) ×2
ELECTRODE REM PT RTRN 9FT ADLT (ELECTROSURGICAL) ×1 IMPLANT
GAUZE SPONGE 4X4 12PLY STRL (GAUZE/BANDAGES/DRESSINGS) ×2 IMPLANT
GLOVE PI ORTHOPRO 6.5 (GLOVE) ×1
GLOVE PI ORTHOPRO STRL 6.5 (GLOVE) ×1 IMPLANT
GLOVE SURG SYN 6.5 ES PF (GLOVE) ×2 IMPLANT
GOWN STRL REUS W/ TWL LRG LVL3 (GOWN DISPOSABLE) ×3 IMPLANT
GOWN STRL REUS W/TWL LRG LVL3 (GOWN DISPOSABLE) ×3
NS IRRIG 1000ML POUR BTL (IV SOLUTION) ×2 IMPLANT
PACK C SECTION AR (MISCELLANEOUS) ×2 IMPLANT
PAD OB MATERNITY 4.3X12.25 (PERSONAL CARE ITEMS) ×2 IMPLANT
PAD PREP 24X41 OB/GYN DISP (PERSONAL CARE ITEMS) ×2 IMPLANT
STRAP SAFETY 5IN WIDE (MISCELLANEOUS) ×2 IMPLANT
STRIP CLOSURE SKIN 1/2X4 (GAUZE/BANDAGES/DRESSINGS) ×2 IMPLANT
SUT MNCRL 4-0 (SUTURE) ×1
SUT MNCRL 4-0 27XMFL (SUTURE) ×1
SUT PDS AB 1 TP1 96 (SUTURE) ×2 IMPLANT
SUT VIC AB 0 CT1 36 (SUTURE) ×4 IMPLANT
SUT VIC AB 2-0 CT1 27 (SUTURE) ×1
SUT VIC AB 2-0 CT1 TAPERPNT 27 (SUTURE) ×1 IMPLANT
SUT VIC AB 3-0 SH 27 (SUTURE) ×1
SUT VIC AB 3-0 SH 27X BRD (SUTURE) ×1 IMPLANT
SUTURE MNCRL 4-0 27XMF (SUTURE) ×1 IMPLANT
SWABSTK COMLB BENZOIN TINCTURE (MISCELLANEOUS) ×2 IMPLANT

## 2018-11-03 NOTE — Anesthesia Procedure Notes (Signed)
Spinal  Patient location during procedure: OR Start time: 11/03/2018 1:25 PM End time: 11/03/2018 1:32 PM Staffing Anesthesiologist: Emmie Niemann, MD Resident/CRNA: Aline Brochure, CRNA Performed: resident/CRNA  Preanesthetic Checklist Completed: patient identified, site marked, surgical consent, pre-op evaluation, timeout performed, IV checked, risks and benefits discussed and monitors and equipment checked Spinal Block Patient position: sitting Prep: ChloraPrep Patient monitoring: heart rate, continuous pulse ox, blood pressure and cardiac monitor Approach: midline Location: L4-5 Injection technique: single-shot Needle Needle type: Introducer and Pencil-Tip  Needle gauge: 24 G Needle length: 9 cm Additional Notes Negative paresthesia. Negative blood return. Positive free-flowing CSF. Expiration date of kit checked and confirmed. Patient tolerated procedure well, without complications.

## 2018-11-03 NOTE — Anesthesia Post-op Follow-up Note (Signed)
Anesthesia QCDR form completed.        

## 2018-11-03 NOTE — Op Note (Signed)
Cesarean Section Procedure Note  11/03/2018   Patient:  Alyssa Moore  27 y.o. female at [redacted]w[redacted]d.  Patient's last menstrual period was 02/01/2018 (exact date). Preoperative diagnosis:  breech Postoperative diagnosis:  breech  PROCEDURE:  Procedure(s): CESAREAN SECTION (N/A) Surgeon:  Surgeon(s) and Role:    * Audra Kagel, Honor Loh, MD - Primary Anesthesia:  spinal I/O: Total I/O In: 500 [I.V.:500] Out: 150 [Urine:150] Specimens:  Cord Blood  Complications: None Apparent Disposition:  VS stable to PACU  Findings: normal uterus, tubes and ovaries bilaterally Live born female  Birth Weight: 6 lb 11.9 oz (3060 g) APGAR: 9, 9  Newborn Delivery   Birth date/time:  11/03/2018 13:56:00 Delivery type:  C-Section, Low Transverse Trial of labor:  No C-section categorization:  Primary      Indication for procedure: 27 y.o. female at [redacted]w[redacted]d with breech presentation, and failed attempt at external cephalic version at 37 weeks.  Procedure Details   The risks, benefits, complications, treatment options, and expected outcomes were discussed with the patient. Informed consent was obtained. The patient was taken to Operating Room, identified as Darrall Dears and the procedure verified as a cesarean delivery.   After administration of anesthesia, the patient was prepped and draped in the usual sterile manner, including a vaginal prep. A surgical time out was performed, with the pediatric team present. After confirming adequate anesthesia, a Pfannenstiel incision was made and carried down through the subcutaneous tissue to the fascia. Fascial incision was made and extended transversely. The fascia was separated from the underlying rectus tissue superiorly and inferiorly. The rectus muscles were divided in the midline. The peritoneum was identified and entered. Peritoneal incision was extended longitudinally.  A low transverse uterine incision was made. Delivered from complete breech presentation was a  live born female. Delayed cord clamping was performed for 60 seconds. The umbilical cord was doubly clamped and cut, and the baby was handed off to the awaitng pediatrician.  Cord blood was obtained for evaluation. The placenta was removed intact and appeared normal. The uterus was delivered from the abdominal cavity and cleared of clots, membranes, and debris. The uterus, tubes and ovaries appeared normal. The uterine incision was closed with running locking sutures of 0 Vicryl, and then a second, imbricating stitch was placed. Hemostasis was observed. The abdominal cavity was evacuated of extraneous fluid. The uterus was returned to the abdominal cavity and again the incision was inspected for hemostasis, which was confirmed.  The paracolic gutters were cleaned. The fascia was then reapproximated with running suture of vicryl. 80cc of Long- and short-acting bupivicaine was injected circumferentially into the fascia.  After a change of gloves, the subcutaneous tissue was irrigated and reapproximated with 3-0 vicryl. The skin was closed with 4-0 Monocryl and 20cc of long- and short-acting bupivacaine injected into the skin and subcutaneous tissues.  The incision was covered with surgical glue. An abdominal binder was placed  We sang happy birthday (in harmony) to baby [name].    Instrument, sponge, and needle counts were correct prior the abdominal closure and at the conclusion of the case.   I was present and performed this procedure in its entirety.  VTE: SCDs Perioperative antibiotics: Ancef 2g  ----- Larey Days, MD Attending Obstetrician and Gynecologist [facility], Department of [facility]

## 2018-11-03 NOTE — Transfer of Care (Signed)
Immediate Anesthesia Transfer of Care Note  Patient: Alyssa Moore  Procedure(s) Performed: CESAREAN SECTION (N/A )  Patient Location: PACU  Anesthesia Type:Spinal  Level of Consciousness: awake, alert  and oriented  Airway & Oxygen Therapy: Patient Spontanous Breathing  Post-op Assessment: Post -op Vital signs reviewed and stable  Post vital signs: stable  Last Vitals:  Vitals Value Taken Time  BP 102/61 11/03/2018  2:57 PM  Temp    Pulse 60 11/03/2018  2:57 PM  Resp 12 11/03/2018  2:57 PM  SpO2 100 % 11/03/2018  2:57 PM    Last Pain:  Vitals:   11/03/18 1207  TempSrc:   PainSc: 0-No pain         Complications: No apparent anesthesia complications

## 2018-11-03 NOTE — Anesthesia Preprocedure Evaluation (Signed)
Anesthesia Evaluation  Patient identified by MRN, date of birth, ID band Patient awake    Reviewed: Allergy & Precautions, NPO status , Patient's Chart, lab work & pertinent test results  History of Anesthesia Complications Negative for: history of anesthetic complications  Airway Mallampati: I  TM Distance: >3 FB Neck ROM: Full    Dental no notable dental hx.    Pulmonary neg sleep apnea, neg COPD, former smoker,    breath sounds clear to auscultation- rhonchi (-) wheezing      Cardiovascular Exercise Tolerance: Good (-) hypertension(-) CAD, (-) Past MI, (-) Cardiac Stents and (-) CABG  Rhythm:Regular Rate:Normal - Systolic murmurs and - Diastolic murmurs    Neuro/Psych negative neurological ROS  negative psych ROS   GI/Hepatic negative GI ROS, Neg liver ROS,   Endo/Other  neg diabetesHypothyroidism   Renal/GU negative Renal ROS     Musculoskeletal negative musculoskeletal ROS (+)   Abdominal Gravid abdomen  Peds  Hematology negative hematology ROS (+)   Anesthesia Other Findings Past Medical History: No date: Hypothyroidism   Reproductive/Obstetrics (+) Pregnancy                             Lab Results  Component Value Date   WBC 10.8 (H) 11/03/2018   HGB 11.6 (L) 11/03/2018   HCT 34.8 (L) 11/03/2018   MCV 96.1 11/03/2018   PLT 209 11/03/2018    Anesthesia Physical Anesthesia Plan  ASA: II  Anesthesia Plan: Spinal   Post-op Pain Management:    Induction:   PONV Risk Score and Plan: 2 and Ondansetron  Airway Management Planned: Natural Airway  Additional Equipment:   Intra-op Plan:   Post-operative Plan:   Informed Consent: I have reviewed the patients History and Physical, chart, labs and discussed the procedure including the risks, benefits and alternatives for the proposed anesthesia with the patient or authorized representative who has indicated his/her  understanding and acceptance.   Dental advisory given  Plan Discussed with: CRNA and Anesthesiologist  Anesthesia Plan Comments:         Anesthesia Quick Evaluation

## 2018-11-03 NOTE — Discharge Summary (Signed)
Obstetrical Discharge Summary  Patient Name: Alyssa Moore DOB: Nov 03, 1991 MRN: [id number]  Date of Admission: 11/03/2018 Date of Delivery: 11/03/18 Delivered by: Larey Days, MD Date of Discharge: 11/05/2018  Primary OB: [facility]  [id number] last menstrual period was 02/01/2018 (exact date). EDC Estimated Date of Delivery: 11/08/18 Gestational Age at Delivery: [redacted]w[redacted]d   Antepartum complications:  1. Breech - failed ECV 2. Hypothyroid  Admitting Diagnosis:  Scheduled cesarean for breech Secondary Diagnosis: Patient Active Problem List   Diagnosis Date Noted  . Malpresentation before onset of labor 10/19/2018    Augmentation: none Complications: None Intrapartum complications/course: none- routine scheduled cesarean. Date of Delivery: 11/03/18 Delivered By: Vikki Ports Ward Delivery Type: primary cesarean section, low transverse incision double layer closure Anesthesia: spinal Placenta: expressed Laceration: n/a Episiotomy: n/a Newborn Data: Live born female "[name]" Birth Weight: 6 lb 11.9 oz (3060 g) APGAR: 9, 9  Newborn Delivery   Birth date/time:  11/03/2018 13:56:00 Delivery type:  C-Section, Low Transverse Trial of labor:  No C-section categorization:  Primary     Postpartum Procedures:   Post partum course:  Patient had an uncomplicated postpartum course.  By time of discharge on POD#2, her pain was controlled on oral pain medications; she had appropriate lochia and was ambulating, voiding without difficulty, tolerating regular diet and passing flatus.   She was deemed stable for discharge to home.    Discharge Physical Exam:  BP 130/82 (BP Location: Left Arm)   Pulse 69   Temp 98 F (36.7 C)   Resp 18   Ht 5\' 3"  (1.6 m)   Wt 80.7 kg   LMP 02/01/2018 (Exact Date)   SpO2 98%   Breastfeeding? Unknown   BMI 31.53 kg/m   General: NAD CV: RRR Pulm: CTABL, nl effort ABD: s/nd/nt, fundus firm and below the umbilicus Lochia:  moderate Incision: c/d/i, no induration, erythema, ecchymosis, covered in surgical glue DVT Evaluation: LE non-ttp, no evidence of DVT on exam.  Hemoglobin  Date Value Ref Range Status  11/04/2018 9.5 (L) 12.0 - 15.0 g/dL Final   HCT  Date Value Ref Range Status  11/04/2018 29.0 (L) 36.0 - 46.0 % Final     Disposition: stable, discharge to home. Baby Feeding: breastmilk  Baby Disposition: home with mom  Rh Immune globulin given: n/a Rubella vaccine given: n/a Tdap vaccine given in AP or PP setting: AP Flu vaccine given in AP or PP setting: AP  Contraception: TBD  Prenatal Labs:    Blood type/Rh O pos  Antibody screen neg  Rubella Immune  Varicella Immune  RPR NR  HBsAg Neg  HIV NR  GC neg  Chlamydia neg  Genetic screening negative  1 hour GTT 89  3 hour GTT --  GBS negative     Plan:  Neelie K Albertsen was discharged to home in good condition. Follow-up appointment with Dr. Leonides Schanz in 2 weeks.  Discharge Medications: Allergies as of 11/05/2018   No Known Allergies     Medication List    TAKE these medications   acetaminophen 500 MG tablet Commonly known as:  TYLENOL Take 2 tablets (1,000 mg total) by mouth every 6 (six) hours.   ferrous sulfate 325 (65 FE) MG tablet Take 1 tablet (325 mg total) by mouth 2 (two) times daily with a meal.   ibuprofen 600 MG tablet Commonly known as:  ADVIL,MOTRIN Take 1 tablet (600 mg total) by mouth every 6 (six) hours.   levothyroxine 88 MCG tablet Commonly known as:  SYNTHROID, LEVOTHROID Take 88-176 mcg by mouth See admin instructions. Take 176 mcg by mouth daily on Sunday. Take 88 mcg by mouth daily on all other days.   oxyCODONE 5 MG immediate release tablet Commonly known as:  Oxy IR/ROXICODONE Take 1 tablet (5 mg total) by mouth every 6 (six) hours as needed (pain scale 4-7).   prenatal multivitamin Tabs tablet Take 1 tablet by mouth daily.   senna-docusate 8.6-50 MG tablet Commonly known as:   Senokot-S Take 2 tablets by mouth daily. Start taking on:  11/06/2018       Follow-up Information    Ward, Honor Loh, MD Follow up in 2 week(s).   Specialty:  Obstetrics and Gynecology Contact information: [address] [place] [address] [phone]           Signed: Francetta Found, CNM 11/05/2018 12:23 PM

## 2018-11-03 NOTE — H&P (Signed)
Preoperative History and Physical  Alyssa Moore is a 27 y.o. G1P0 here for surgical management of breech presentation at term.   No significant preoperative concerns.  Proposed surgery: cesarean delivery  Past Medical History:  Diagnosis Date  . Hypothyroidism    Past Surgical History:  Procedure Laterality Date  . EYE SURGERY    . FRACTURE SURGERY    . WISDOM TOOTH EXTRACTION     OB History  Gravida Para Term Preterm AB Living  1         0  SAB TAB Ectopic Multiple Live Births               # Outcome Date GA Lbr Len/2nd Weight Sex Delivery Anes PTL Lv  1 Current           Patient denies any other pertinent gynecologic issues.   No current facility-administered medications on file prior to encounter.    Current Outpatient Medications on File Prior to Encounter  Medication Sig Dispense Refill  . levothyroxine (SYNTHROID, LEVOTHROID) 88 MCG tablet Take 88-176 mcg by mouth See admin instructions. Take 176 mcg by mouth daily on Sunday. Take 88 mcg by mouth daily on all other days.    . Prenatal Vit-Fe Fumarate-FA (PRENATAL MULTIVITAMIN) TABS tablet Take 1 tablet by mouth daily.      No Known Allergies  Social History:   reports that she has quit smoking. She has never used smokeless tobacco. She reports that she drank alcohol. She reports that she does not use drugs.  History reviewed. No pertinent family history.  Review of Systems: Noncontributory  PHYSICAL EXAM: Blood pressure 125/72, pulse 70, temperature 98.4 F (36.9 C), temperature source Oral, resp. rate 16, height 5\' 3"  (1.6 m), weight 80.7 kg, last menstrual period 02/01/2018. General appearance - alert, well appearing, and in no distress Chest - clear to auscultation, no wheezes, rales or rhonchi, symmetric air entry Heart - normal rate and regular rhythm Abdomen - soft, nontender, nondistended, no masses or organomegaly Pelvic - examination not indicated Extremities - peripheral pulses normal, no pedal  edema, no clubbing or cyanosis  Labs: Results for orders placed or performed during the hospital encounter of 11/03/18 (from the past 336 hour(s))  CBC   Collection Time: 11/03/18 11:40 AM  Result Value Ref Range   WBC 10.8 (H) 4.0 - 10.5 K/uL   RBC 3.62 (L) 3.87 - 5.11 MIL/uL   Hemoglobin 11.6 (L) 12.0 - 15.0 g/dL   HCT 34.8 (L) 36.0 - 46.0 %   MCV 96.1 80.0 - 100.0 fL   MCH 32.0 26.0 - 34.0 pg   MCHC 33.3 30.0 - 36.0 g/dL   RDW 13.5 11.5 - 15.5 %   Platelets 209 150 - 400 K/uL   nRBC 0.0 0.0 - 0.2 %  Results for orders placed or performed during the hospital encounter of 11/02/18 (from the past 336 hour(s))  Type and screen [place]   Collection Time: 11/02/18  9:13 AM  Result Value Ref Range   ABO/RH(D) O POS    Antibody Screen NEG    Sample Expiration 11/05/2018    Extend sample reason      PREGNANT WITHIN 3 MONTHS, UNABLE TO EXTEND Performed at [facility], [address], [place], [place] [address]     Imaging Studies: No results found.  Assessment: Patient Active Problem List   Diagnosis Date Noted  . Malpresentation before onset of labor 10/19/2018    Plan: Patient  will undergo surgical management with cesarean delivery.  The risks of surgery were discussed in detail with the patient including but not limited to: bleeding which may require transfusion or reoperation; infection which may require antibiotics; injury to surrounding organs which may involve bowel, bladder, ureters ; need for additional procedures including laparoscopy or laparotomy; thromboembolic phenomenon, surgical site problems and other postoperative/anesthesia complications. Likelihood of success in alleviating the patient's condition was discussed. Routine postoperative instructions will be reviewed with the patient and her family in detail after surgery.  The patient concurred with the proposed plan, giving informed written consent for the surgery.  Patient has  been NPO since last night she will remain NPO for procedure.  Anesthesia and OR aware.  Preoperative prophylactic antibiotics and SCDs ordered on call to the OR.  To OR when ready.  ----- Larey Days, MD Attending Obstetrician and Gynecologist [facility], Department of [facility]

## 2018-11-04 ENCOUNTER — Encounter: Payer: Self-pay | Admitting: Obstetrics & Gynecology

## 2018-11-04 LAB — RPR: RPR: NONREACTIVE

## 2018-11-04 LAB — CBC
HCT: 29 % — ABNORMAL LOW (ref 36.0–46.0)
Hemoglobin: 9.5 g/dL — ABNORMAL LOW (ref 12.0–15.0)
MCH: 31.8 pg (ref 26.0–34.0)
MCHC: 32.8 g/dL (ref 30.0–36.0)
MCV: 97 fL (ref 80.0–100.0)
PLATELETS: 156 10*3/uL (ref 150–400)
RBC: 2.99 MIL/uL — ABNORMAL LOW (ref 3.87–5.11)
RDW: 13.6 % (ref 11.5–15.5)
WBC: 11.5 10*3/uL — AB (ref 4.0–10.5)
nRBC: 0 % (ref 0.0–0.2)

## 2018-11-04 NOTE — Progress Notes (Signed)
Subjective: Postpartum Day 1: primary LTCesarean Delivery Patient reports tolerating PO.  Pain controlled. Out of bed. Breastfeeding well.  Objective: Vital signs in last 24 hours: Temp:  [97.9 F (36.6 C)-98.5 F (36.9 C)] 98.5 F (36.9 C) (11/20 1531) Pulse Rate:  [69-75] 69 (11/20 1531) Resp:  [18] 18 (11/20 1531) BP: (110-121)/(57-77) 121/64 (11/20 1531) SpO2:  [97 %-100 %] 97 % (11/20 1531)  Physical Exam:  General: alert and cooperative Lochia: appropriate Uterine Fundus: firm Incision: healing well, no significant drainage, no dehiscence, no significant erythema DVT Evaluation: No evidence of DVT seen on physical exam.  Recent Labs    11/03/18 1140 11/04/18 0516  HGB 11.6* 9.5*  HCT 34.8* 29.0*    Assessment/Plan: Status post Cesarean section. Doing well postoperatively.  Continue current care. Iron deficiency anemia: po iron Breastfeeding  Alyssa Moore 11/04/2018, 6:48 PM

## 2018-11-04 NOTE — Lactation Note (Signed)
This note was copied from a baby's chart. Lactation Consultation Note  Patient Name: Alyssa Moore [id number] Date: 11/04/2018 Reason for consult: Initial assessment   Maternal Data    Feeding Feeding Type: Breast Fed  LATCH Score                   Interventions    Lactation Tools Discussed/Used     Consult Status Consult Status: Follow-up Date: 11/04/18 Follow-up type: In-patient   LC called to room by parents to observe Alyssa Moore's latch. Mother had already latched Alyssa Moore on her own upon arrival. [name] is in the football position and mother states that she does not feel pain or discomfort and that she has been on the breast for about 15 minutes and is now falling asleep. LC suggested that she burp infant and see if she is showing cues to feed again. Mother states that infant is having sufficient wet and dirty diapers. LC discussed breastfeeding basics with mother and reassured her that infant is doing what she is expected to do right now for her age. Mother will call lactation if she has additional questions or concerns.   Elvera Lennox [id number]/[id number], 1:17 PM

## 2018-11-04 NOTE — Anesthesia Postprocedure Evaluation (Signed)
Anesthesia Post Note  Patient: Alyssa Moore  Procedure(s) Performed: CESAREAN SECTION (N/A )  Patient location during evaluation: Nursing Unit Anesthesia Type: Spinal Level of consciousness: awake, awake and alert and oriented Pain management: pain level controlled Vital Signs Assessment: post-procedure vital signs reviewed and stable Respiratory status: spontaneous breathing Cardiovascular status: blood pressure returned to baseline and stable Postop Assessment: no headache Anesthetic complications: no Comments: Sitting in chair, nursing.  VSS. Has emptied bladder.  Looks great.     Last Vitals:  Vitals:   11/03/18 2311 11/04/18 0427  BP: (!) 119/57 111/69  Pulse: 73 69  Resp: 18 18  Temp: 36.6 C 36.8 C  SpO2: 100% 99%    Last Pain:  Vitals:   11/04/18 0427  TempSrc: Oral  PainSc:                  Buckner [place]

## 2018-11-05 MED ORDER — IBUPROFEN 600 MG PO TABS: 600.0000 mg | ORAL_TABLET | Freq: Four times a day (QID) | ORAL | 0 refills | Status: DC

## 2018-11-05 MED ORDER — FERROUS SULFATE 325 (65 FE) MG PO TABS
325.0000 mg | ORAL_TABLET | Freq: Two times a day (BID) | ORAL | Status: DC
  Administered 2018-11-05: 325 mg via ORAL
  Filled 2018-11-05: qty 1

## 2018-11-05 MED ORDER — SENNOSIDES-DOCUSATE SODIUM 8.6-50 MG PO TABS: 2.0000 | ORAL_TABLET | ORAL | Status: DC

## 2018-11-05 MED ORDER — ACETAMINOPHEN 500 MG PO TABS: 1000.0000 mg | ORAL_TABLET | Freq: Four times a day (QID) | ORAL | 0 refills | Status: DC

## 2018-11-05 MED ORDER — FERROUS SULFATE 325 (65 FE) MG PO TABS: 325.0000 mg | ORAL_TABLET | Freq: Two times a day (BID) | ORAL | 0 refills | Status: DC

## 2018-11-05 MED ORDER — OXYCODONE HCL 5 MG PO TABS: 5.0000 mg | ORAL_TABLET | Freq: Four times a day (QID) | ORAL | 0 refills | Status: DC | PRN

## 2018-11-05 NOTE — Progress Notes (Signed)
Post Partum Day 2 Subjective: Doing well, no complaints.  Tolerating regular diet, pain with PO meds, voiding and ambulating without difficulty.  No CP SOB Fever,Chills, N/V or leg pain; denies nipple or breast pain; no HA change of vision, RUQ/epigastric pain  Objective: BP 130/82 (BP Location: Left Arm)   Pulse 69   Temp 98 F (36.7 C)   Resp 18   Ht 5\' 3"  (1.6 m)   Wt 80.7 kg   LMP 02/01/2018 (Exact Date)   SpO2 98%   Breastfeeding? Unknown   BMI 31.53 kg/m    Physical Exam:  General: NAD Breasts: soft/nontender CV: RRR Pulm: nl effort, CTABL Abdomen: soft, NT, BS x 4 Incision: dermabond CDI, no erythema or drainage Lochia: scant Uterine Fundus: fundus firm and 2 fb below umbilicus DVT Evaluation: no cords, ttp LEs   Recent Labs    11/03/18 1140 11/04/18 0516  HGB 11.6* 9.5*  HCT 34.8* 29.0*  WBC 10.8* 11.5*  PLT 209 156    Assessment/Plan: 27 y.o. G1P1001 postpartum day # 2  - Continue routine PP care - Lactation consult - Discussed contraceptive options including implant, IUDs hormonal and non-hormonal, injection, pills/ring/patch, condoms, and NFP. Plans to start POPs at 4wks - Acute blood loss anemia - hemodynamically stable and asymptomatic; start po ferrous sulfate BID with stool softeners  - Immunization status: all Imms up to date    Disposition: Does desire Dc home today.     Francetta Found, CNM 11/05/2018  12:18 PM

## 2018-12-07 ENCOUNTER — Ambulatory Visit: Payer: Self-pay

## 2018-12-07 NOTE — Lactation Note (Signed)
This note was copied from a baby's chart. Lactation Consultation Note  Patient Name: [name] Date: 12/07/2018     Maternal Data  Mom called in stating that she doesn't think her baby is getting enough milk during nursing sessions so [facility] scheduled an outpatient consultation for the dyad. Mom states that MD has weight gain concerns and to begin supplementing after every feeding. Mom states her nipples/ breasts do not hurt/aren't damaged from latching baby on.  Feeding  "[name]" stayed latched onto Mom's right breast or approx 93mins and transferred 9mL. Mom states that baby marathon nurses and that baby never seems to feel full. Mom also stated that she's supplementing with formula per MD recommendations at baby's 41mos appt. Last week and was told to supplement with 1oz after every feeding. Baby still seems hungry, so mom supplements with 2oz and baby is satisfied.   LATCH Score  5                 Interventions  DEBP, pillows, haakaa  Lactation Tools Discussed/Used  DEBP, haakaa, slow flow bottle nipple, supplementation of breastmilk and/or formula   Consult Status  After evaluating baby's feeding, LC has determined that baby may have some potential concerns for a possible tongue and/or lip restrictions. Mom states the following concerns leading to potential concern: slow weight gain, low milk supply, breasts do not feel full, baby slips off breasts easily, gassy, fuss, baby marathon nurses, etc.  LC called Pediatric Dentist Marcelo Baldy to see if dyad could be brought in for consultation and he will reach out to mom of baby this afternoon to see if he has availability on Friday 12/27. He will then evaluate, have parents make a decision if needed for frenectomy and refer back to [facility] for breastfeeding continuation.  In the meantime, mom is to limit nursing sessions to 57mins on one breast and continue to alternate at feedings. On the side she doesn't feed from, use  haakaa to assist with building milk supply. If Hakaa isn't used, mom is to pump both breasts. Mom is to f/u nursing sessions with supplementation of up to 2-2.5oz and burp baby halfway through feeding to alleviate gas. Paced bottle feeding and safe formula prep was also taught to patient's mother during this [place] visit.  LC will contact dyad in 1 week to check in on progress.  Baby's wt today: 3496g pre feeding    Marnee Spring 12/07/2018, 3:40 PM

## 2018-12-14 DIAGNOSIS — Z1332 Encounter for screening for maternal depression: Secondary | ICD-10-CM | POA: Diagnosis not present

## 2018-12-28 ENCOUNTER — Telehealth: Payer: Self-pay | Admitting: Family Medicine

## 2018-12-28 NOTE — Telephone Encounter (Signed)
Pt needing referral to Dermatologist - [facility]inology at [facility]  Pt states this is due to her new insurance and she has already been established with both of them for over a year.    Pt requesting a call back to find out if she needs to come in for appt or not.

## 2018-12-28 NOTE — Telephone Encounter (Signed)
Sarah, I don't see that anyone in our office sees her?

## 2018-12-29 NOTE — Telephone Encounter (Signed)
Please review. Thanks!  

## 2018-12-29 NOTE — Telephone Encounter (Signed)
I am happy to see if she wishes.

## 2018-12-29 NOTE — Telephone Encounter (Signed)
This patient states that she saw Dr. Rosanna Randy in the past.  In the old system her name is [employer].  She was last seen 05/14/2011.  I advised the patient that she would be considered a new patient because it has been over five years.  She is going to call her insurance and see if they will allow her OB doctor to refer her.

## 2018-12-29 NOTE — Telephone Encounter (Signed)
New patient appt scheduled.

## 2019-02-10 ENCOUNTER — Ambulatory Visit: Payer: Self-pay | Admitting: Physician Assistant

## 2019-02-16 ENCOUNTER — Ambulatory Visit (INDEPENDENT_AMBULATORY_CARE_PROVIDER_SITE_OTHER): Payer: No Typology Code available for payment source | Admitting: Family Medicine

## 2019-02-16 ENCOUNTER — Encounter: Payer: Self-pay | Admitting: Family Medicine

## 2019-02-16 VITALS — BP 108/70 | HR 72 | Temp 99.3°F | Resp 16 | Ht 63.0 in | Wt 152.0 lb

## 2019-02-16 DIAGNOSIS — Z0000 Encounter for general adult medical examination without abnormal findings: Secondary | ICD-10-CM

## 2019-02-16 DIAGNOSIS — E039 Hypothyroidism, unspecified: Secondary | ICD-10-CM

## 2019-02-16 NOTE — Progress Notes (Signed)
Patient: Alyssa Moore, Female    DOB: [birth year], 28 y.o.   MRN: [id number] Visit Date: 02/16/2019  Today's Provider: [name], MD   Chief Complaint  Patient presents with  . Re establish care   Subjective:    New Patient  Alyssa Moore is a 28 y.o. female who presents today for health maintenance and establish patient care. She feels well . She reports she is sleeping well. She is follow-up for GYN and OB care by Dr. Larey Days.  Her daughter Alyssa Moore was born November 03, 2018. She has been followed in the past for thyroid by Dr. Elisabeth Cara due to pregnancy.  Her dermatologist is Dr. Phillip Heal.  Review of Systems  Constitutional: Negative.   HENT: Negative.   Eyes: Negative.   Respiratory: Negative.   Cardiovascular: Negative.   Gastrointestinal: Negative.   Endocrine: Negative.   Genitourinary: Negative.   Musculoskeletal: Negative.   Skin: Negative.   Allergic/Immunologic: Negative.   Neurological: Negative.   Hematological: Negative.   Psychiatric/Behavioral: Negative.     Social History She  reports that she has quit smoking. She has never used smokeless tobacco. She reports previous alcohol use. She reports that she does not use drugs. Social History   Socioeconomic History  . Marital status: Married    Spouse name: Not on file  . Number of children: Not on file  . Years of education: Not on file  . Highest education level: Not on file  Occupational History  . Not on file  Social Needs  . Financial resource strain: Not hard at all  . Food insecurity:    Worry: Never true    Inability: Never true  . Transportation needs:    Medical: No    Non-medical: No  Tobacco Use  . Smoking status: Former Research scientist (life sciences)  . Smokeless tobacco: Never Used  Substance and Sexual Activity  . Alcohol use: Not Currently    Alcohol/week: 0.0 standard drinks    Frequency: Never  . Drug use: Never  . Sexual activity: Yes    Birth control/protection: Pill    Lifestyle  . Physical activity:    Days per week: Not on file    Minutes per session: Not on file  . Stress: Not at all  Relationships  . Social connections:    Talks on phone: Three times a week    Gets together: Three times a week    Attends religious service: More than 4 times per year    Active member of club or organization: Yes    Attends meetings of clubs or organizations: Never    Relationship status: Married  Other Topics Concern  . Not on file  Social History Narrative  . Not on file    Patient Active Problem List   Diagnosis Date Noted  . Malpresentation before onset of labor 10/19/2018    Past Surgical History:  Procedure Laterality Date  . CESAREAN SECTION N/A 11/03/2018   Procedure: CESAREAN SECTION;  Surgeon: Ward, Honor Loh, MD;  Location: [facility] ORS;  Service: Obstetrics;  Laterality: N/A;  . EYE SURGERY    . FRACTURE SURGERY    . WISDOM TOOTH EXTRACTION      Family History  Family Status  Relation Name Status  . Mother  Alive  . Father  Alive  . Brother  Alive  . Brother  Alive   Her family history includes Healthy in her mother; Heart failure in her father; Lymphoma in  her father; Melanoma in her father.     No Known Allergies  Previous Medications   ACETAMINOPHEN (TYLENOL) 500 MG TABLET    Take 2 tablets (1,000 mg total) by mouth every 6 (six) hours.   FERROUS SULFATE 325 (65 FE) MG TABLET    Take 1 tablet (325 mg total) by mouth 2 (two) times daily with a meal.   IBUPROFEN (ADVIL,MOTRIN) 600 MG TABLET    Take 1 tablet (600 mg total) by mouth every 6 (six) hours.   LEVOTHYROXINE (SYNTHROID, LEVOTHROID) 88 MCG TABLET    Take 88-176 mcg by mouth See admin instructions. Take 176 mcg by mouth daily on Sunday. Take 88 mcg by mouth daily on all other days.   NORGESTIMATE-ETHINYL ESTRADIOL TRIPHASIC (TRI-SPRINTEC) 0.18/0.215/0.25 MG-35 MCG TABLET    Take 1 tablet by mouth daily.   OXYCODONE (OXY IR/ROXICODONE) 5 MG IMMEDIATE RELEASE TABLET    Take 1  tablet (5 mg total) by mouth every 6 (six) hours as needed (pain scale 4-7).   PRENATAL VIT-FE FUMARATE-FA (PRENATAL MULTIVITAMIN) TABS TABLET    Take 1 tablet by mouth daily.    SENNA-DOCUSATE (SENOKOT-S) 8.6-50 MG TABLET    Take 2 tablets by mouth daily.    Patient Care Team: Jerrol Banana., MD as PCP - General (Family Medicine)      Objective:   Vitals: BP 108/70 (BP Location: Left Arm, Patient Position: Sitting, Cuff Size: Normal)   Pulse 72   Temp 99.3 F (37.4 C)   Resp 16   Ht 5\' 3"  (1.6 m)   Wt 152 lb (68.9 kg)   LMP 02/16/2019   SpO2 97%   BMI 26.93 kg/m    Physical Exam Constitutional:      Appearance: Normal appearance.  HENT:     Head: Normocephalic and atraumatic.     Right Ear: Tympanic membrane and external ear normal.     Left Ear: External ear normal.     Nose: Nose normal.     Mouth/Throat:     Pharynx: Oropharynx is clear.  Eyes:     General: No scleral icterus.    Extraocular Movements: Extraocular movements intact.     Conjunctiva/sclera: Conjunctivae normal.     Pupils: Pupils are equal, round, and reactive to light.  Neck:     Musculoskeletal: Neck supple.  Cardiovascular:     Rate and Rhythm: Regular rhythm.     Heart sounds: Normal heart sounds.  Pulmonary:     Effort: Pulmonary effort is normal.     Breath sounds: Normal breath sounds.  Musculoskeletal:     Right lower leg: No edema.     Left lower leg: No edema.  Lymphadenopathy:     Cervical: No cervical adenopathy.  Skin:    General: Skin is warm and dry.     Coloration: Skin is not jaundiced.  Neurological:     General: No focal deficit present.     Mental Status: She is alert and oriented to person, place, and time. Mental status is at baseline.  Psychiatric:        Mood and Affect: Mood normal.        Behavior: Behavior normal.        Thought Content: Thought content normal.        Judgment: Judgment normal.      Depression Screen PHQ 2/9 Scores 02/16/2019  PHQ  - 2 Score 0      Assessment & Plan:     Routine  Health Maintenance and Physical Exam  Exercise Activities and Dietary recommendations Goals   None      There is no immunization history on file for this patient.  Health Maintenance  Topic Date Due  . TETANUS/TDAP  05/04/2008  . PAP-Cervical Cytology Screening  05/04/2010  . PAP SMEAR-Modifier  05/04/2010  . INFLUENZA VACCINE  Completed  . HIV Screening  Completed    She says Dr. Leonides Schanz has  update her Tdap during her pregnancy.  Overall health is very good..  I will see her back in 1 year. Discussed health benefits of physical activity, and encouraged her to engage in regular exercise appropriate for her age and condition.  I have done the exam and reviewed the chart and it is accurate to the best of my knowledge. Development worker, community has been used and  any errors in dictation or transcription are unintentional. Miguel Aschoff M.D. [facility]

## 2019-02-23 ENCOUNTER — Telehealth: Payer: Self-pay

## 2019-02-23 LAB — CBC WITH DIFFERENTIAL/PLATELET
BASOS: 1 %
Basophils Absolute: 0.1 10*3/uL (ref 0.0–0.2)
EOS (ABSOLUTE): 0.2 10*3/uL (ref 0.0–0.4)
Eos: 3 %
HEMATOCRIT: 39.9 % (ref 34.0–46.6)
Hemoglobin: 13.4 g/dL (ref 11.1–15.9)
Immature Grans (Abs): 0 10*3/uL (ref 0.0–0.1)
Immature Granulocytes: 0 %
LYMPHS ABS: 2.4 10*3/uL (ref 0.7–3.1)
Lymphs: 28 %
MCH: 31.1 pg (ref 26.6–33.0)
MCHC: 33.6 g/dL (ref 31.5–35.7)
MCV: 93 fL (ref 79–97)
Monocytes Absolute: 0.5 10*3/uL (ref 0.1–0.9)
Monocytes: 6 %
NEUTROS ABS: 5.5 10*3/uL (ref 1.4–7.0)
Neutrophils: 62 %
Platelets: 268 10*3/uL (ref 150–450)
RBC: 4.31 x10E6/uL (ref 3.77–5.28)
RDW: 11.8 % (ref 11.7–15.4)
WBC: 8.8 10*3/uL (ref 3.4–10.8)

## 2019-02-23 LAB — COMPREHENSIVE METABOLIC PANEL
A/G RATIO: 1.5 (ref 1.2–2.2)
ALT: 13 IU/L (ref 0–32)
AST: 13 IU/L (ref 0–40)
Albumin: 4.2 g/dL (ref 3.9–5.0)
Alkaline Phosphatase: 57 IU/L (ref 39–117)
BILIRUBIN TOTAL: 0.4 mg/dL (ref 0.0–1.2)
BUN/Creatinine Ratio: 16 (ref 9–23)
BUN: 12 mg/dL (ref 6–20)
CALCIUM: 9 mg/dL (ref 8.7–10.2)
CO2: 21 mmol/L (ref 20–29)
Chloride: 104 mmol/L (ref 96–106)
Creatinine, Ser: 0.75 mg/dL (ref 0.57–1.00)
GFR, EST AFRICAN AMERICAN: 125 mL/min/{1.73_m2} (ref >59)
GFR, EST NON AFRICAN AMERICAN: 108 mL/min/{1.73_m2} (ref >59)
GLOBULIN, TOTAL: 2.8 g/dL (ref 1.5–4.5)
Glucose: 88 mg/dL (ref 65–99)
Potassium: 4.6 mmol/L (ref 3.5–5.2)
Sodium: 140 mmol/L (ref 134–144)
Total Protein: 7 g/dL (ref 6.0–8.5)

## 2019-02-23 LAB — LIPID PANEL
CHOL/HDL RATIO: 3 ratio (ref 0.0–4.4)
Cholesterol, Total: 194 mg/dL (ref 100–199)
HDL: 64 mg/dL (ref >39)
LDL CALC: 104 mg/dL — AB (ref 0–99)
Triglycerides: 132 mg/dL (ref 0–149)
VLDL CHOLESTEROL CAL: 26 mg/dL (ref 5–40)

## 2019-02-23 NOTE — Telephone Encounter (Signed)
Patient advised as directed below. 

## 2019-02-23 NOTE — Telephone Encounter (Signed)
-----   Message from Jerrol Banana., MD sent at 02/23/2019  8:20 AM EDT ----- Labs good.

## 2019-04-05 ENCOUNTER — Telehealth: Payer: Self-pay | Admitting: *Deleted

## 2019-04-05 NOTE — Telephone Encounter (Signed)
Please review. Thanks!  

## 2019-04-05 NOTE — Telephone Encounter (Signed)
Okay.  It is also okay if she needs referral.

## 2019-04-05 NOTE — Telephone Encounter (Signed)
Patient called to inform Dr. Rosanna Randy that she has an up coming appt with her Endocrinologist. Patient states due to her insurance she has to inform her pcp about all outside appointments.

## 2019-05-28 IMAGING — US US MFM OB COMPLETE +14 WKS
1 series · 13 of 28 positions shown · non-contrast
Comparison: none

PATIENT INFO:

PERFORMED BY:
YHASMANI
KENNEDI
SERVICE(S) PROVIDED:
INDICATIONS:
13 weeks gestation of pregnancy
FETAL EVALUATION:
Num Of Fetuses:     1
Fetal Heart         149
Rate(bpm):
Cardiac Activity:   Present
Presentation:       Oblique
Placenta:           Anterior , fundal
BIOMETRY:
CRL:      67.6  mm     G. Age:  13w 0d                  EDD:   11/05/18
GESTATIONAL AGE:
LMP:           14w 1d        Date:  01/21/18                 EDD:   10/28/18
Best:          13w 0d     Det. By:  U/S C R L (04/30/18)     EDD:   11/05/18
1ST TRIMESTER GENETIC SONOGRAM SCREENING:
CRL:            67.6  mm    G. Age:   13w 0d                 EDD:   11/05/18
Nuc Trans:       1.3  mm
ANATOMY:
Cranium:               Within Normal Limits   Cord Vessels:           3 Vessels
Choroid Plexus:        Within normal limits   Bladder:                Seen
for gestational age
Stomach:               Seen                   Upper Extremities:      Visualized
Abdominal Wall:        Within normal limits   Lower Extremities:      Visualized
CERVIX UTERUS ADNEXA:
Cervix
Length:           4.87  cm.

[Series 1: us mfm ob complete +14 wks · 0.22mm/px · 13 of 76 slices shown]
[im 3/76]
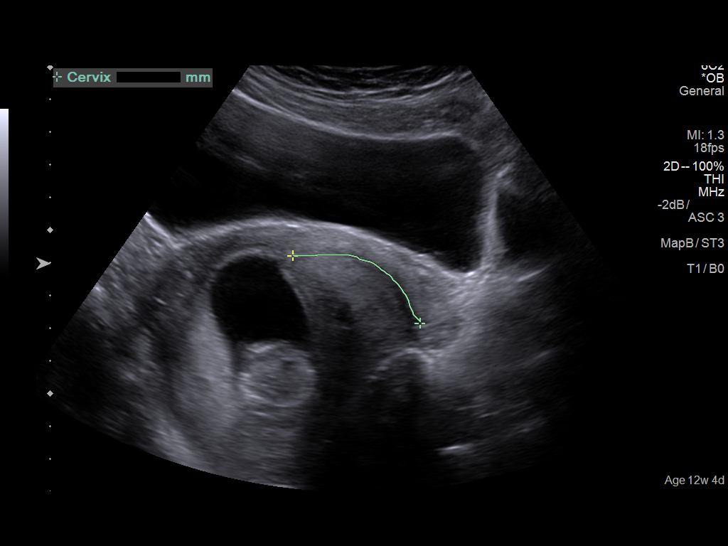
[im 9/76]
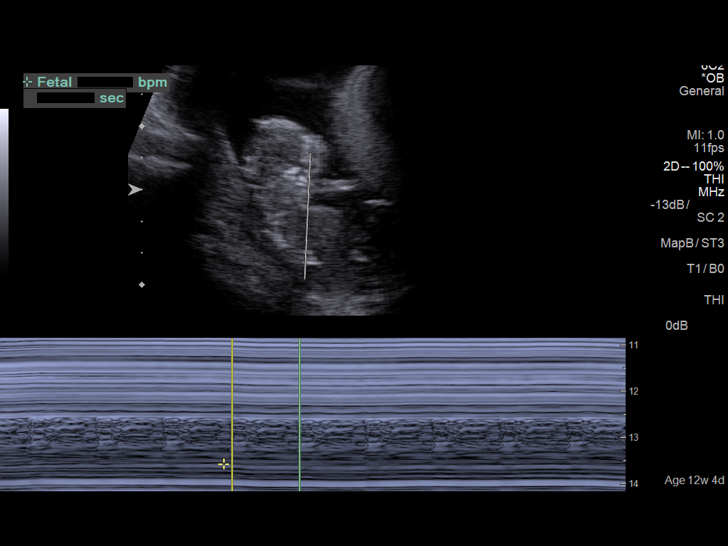
[im 14/76]
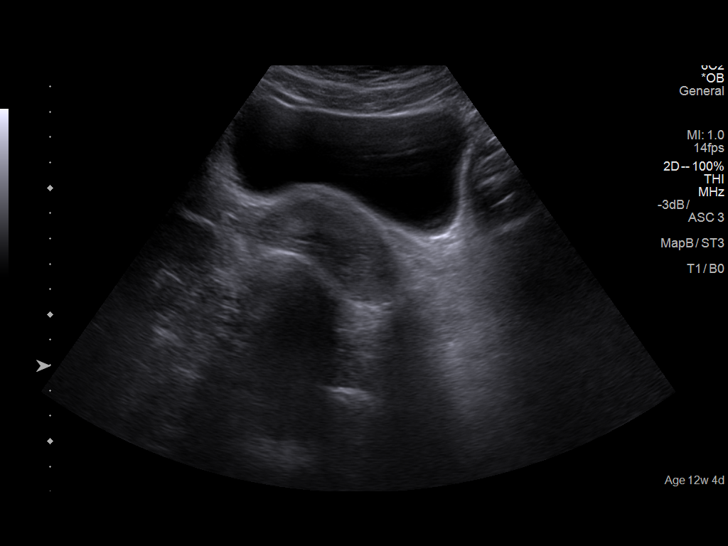
[im 20/76]
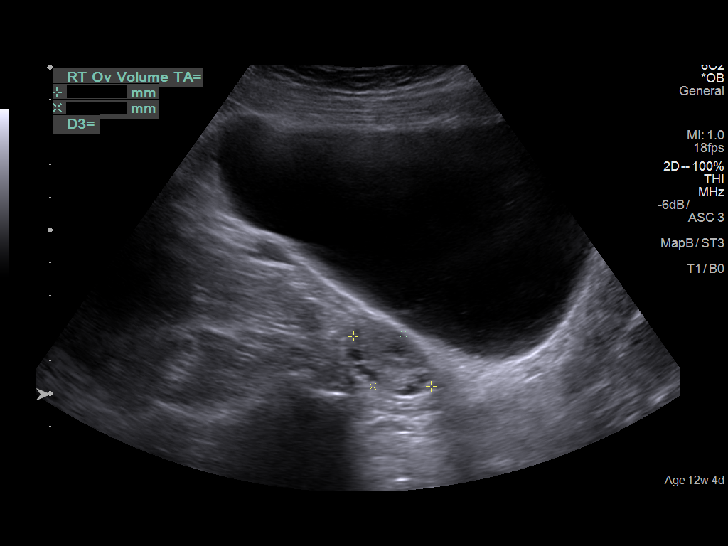
[im 26/76]
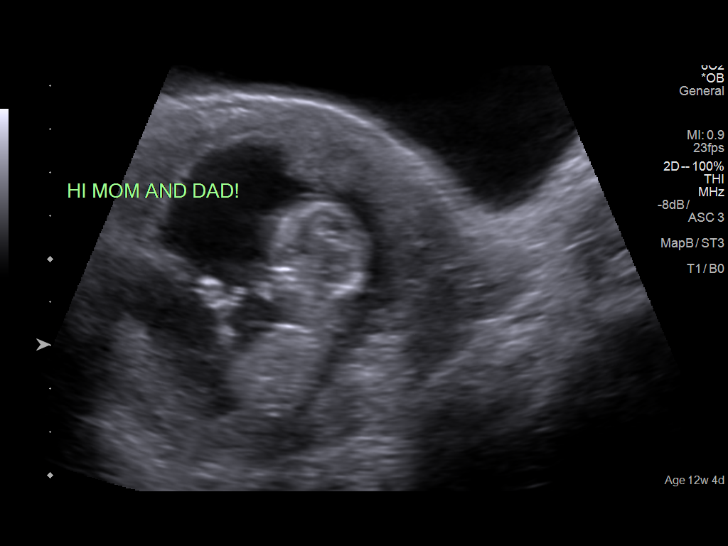
[im 31/76]
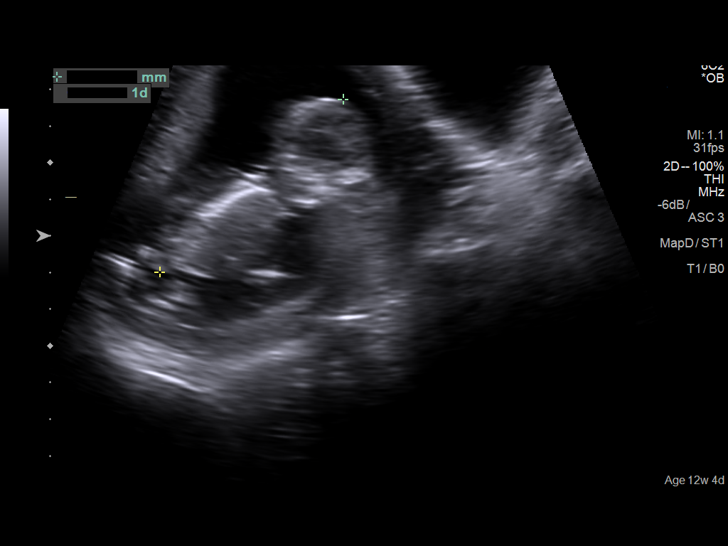
[im 39/76]
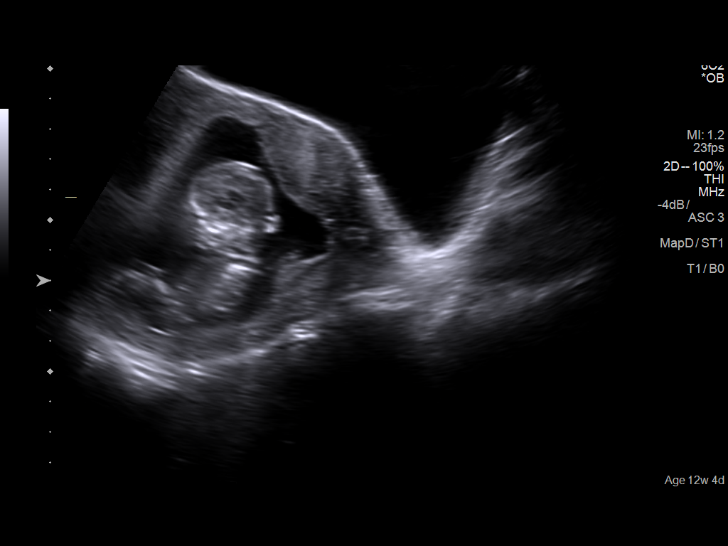
[im 45/76]
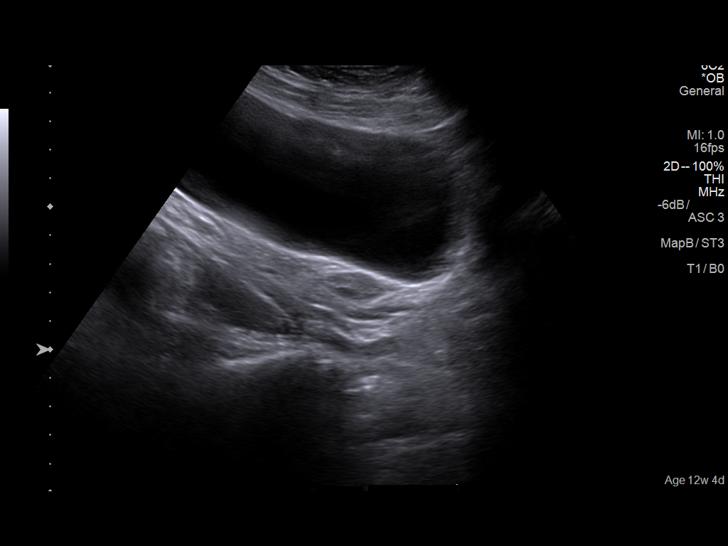
[im 51/76]
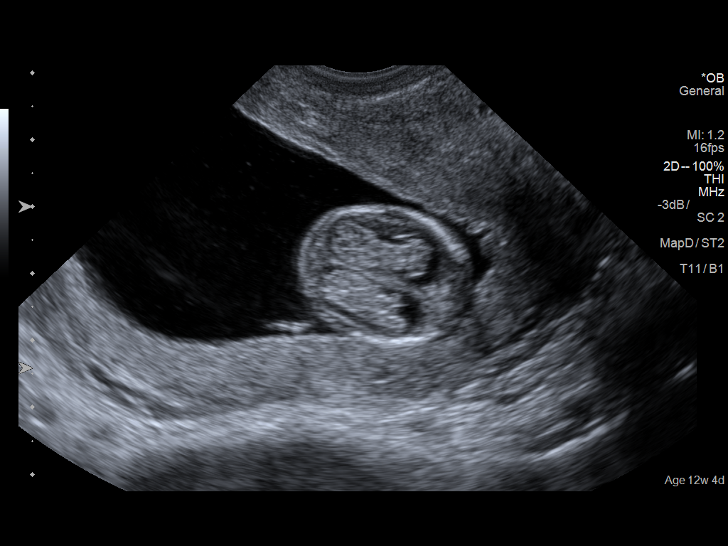
[im 56/76]
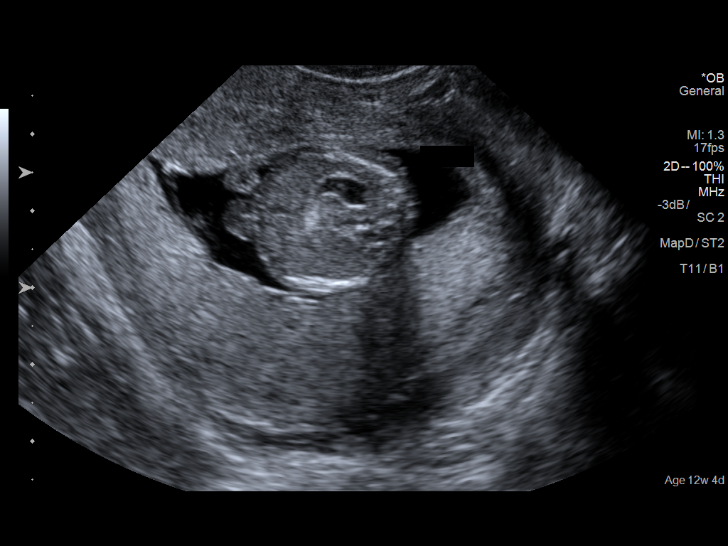
[im 62/76]
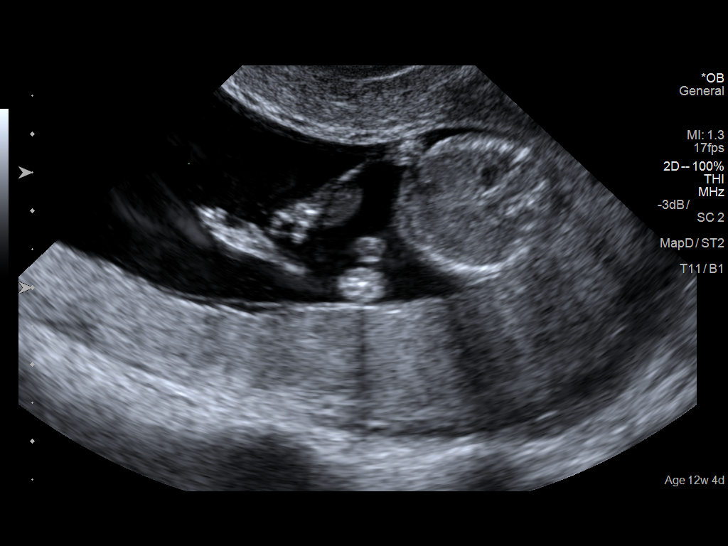
[im 67/76]
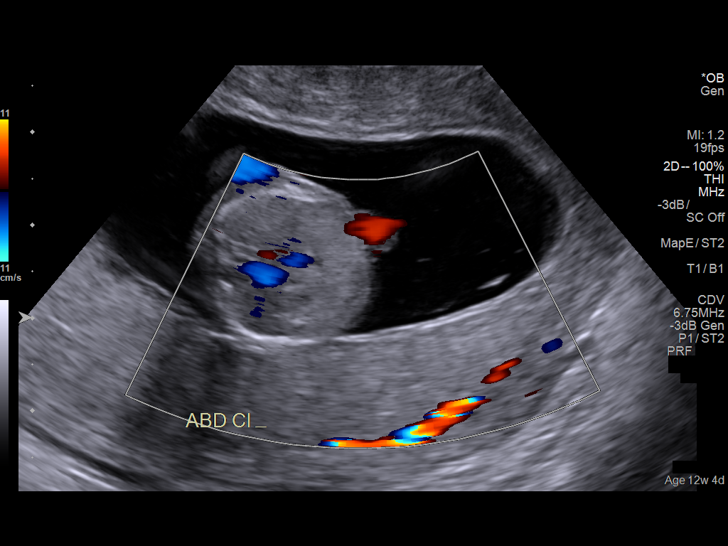
[im 73/76]
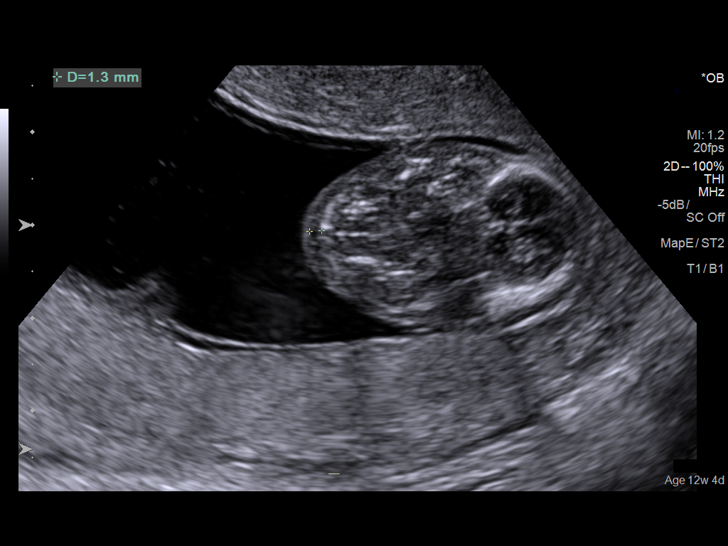

[13 of 28 positions shown; findings below may reference images not displayed]

IMPRESSION: Dear Ms. Polin,

Thank you for referring your patient to [facility] for  first
trimester screening.

The patient met with our genetic counselor and decided to
proceed.

A singleton gestation is noted at 02w9d gestation.

The nuchal translucency measured  1.3  mm.

The right ovary appeared normal.  The left ovary was not
visualized.

The patient had blood drawn for first trimester screening.

A composite risk incorporating her age, nuchal translucency
measurement and blood results should be returned to your
office shortly.

Thank you for allowing us to participate in your patient's care.

assistance.

## 2019-09-22 ENCOUNTER — Encounter: Payer: Self-pay | Admitting: Physician Assistant

## 2019-09-22 ENCOUNTER — Telehealth (INDEPENDENT_AMBULATORY_CARE_PROVIDER_SITE_OTHER): Payer: No Typology Code available for payment source | Admitting: Physician Assistant

## 2019-09-22 DIAGNOSIS — Z20822 Contact with and (suspected) exposure to covid-19: Secondary | ICD-10-CM

## 2019-09-22 DIAGNOSIS — Z20828 Contact with and (suspected) exposure to other viral communicable diseases: Secondary | ICD-10-CM | POA: Diagnosis not present

## 2019-09-22 NOTE — Progress Notes (Signed)
Patient: Alyssa Moore Female    DOB: May 12, 1991   28 y.o.   MRN: [id number] Visit Date: 09/22/2019  Today's Provider: Trinna Post, PA-C   Chief Complaint  Patient presents with  . Cough   Subjective:    Virtual Visit via Video Note  I connected with Alyssa Moore on 09/22/19 at  9:20 AM EDT by a video enabled telemedicine application and verified that I am speaking with the correct person using two identifiers Location: Patient: Home Provider: Office     HPI  Patient reports husband's coworker was positive - asymptomatic. Husband himself is not having any symptoms. Husband tested yesterday and results are pending. Patient reports she has a sore throat, nasal congestion and a cough. She reports otherwise feeling fine. She reports feeling well. Reports close friend was positive and she was around her on Saturday  No Known Allergies   Current Outpatient Medications:  .  levothyroxine (SYNTHROID) 75 MCG tablet, Take one tablet daily., Disp: , Rfl:  .  Norgestimate-Ethinyl Estradiol Triphasic (TRI-SPRINTEC) 0.18/0.215/0.25 MG-35 MCG tablet, Take 1 tablet by mouth daily., Disp: , Rfl:  .  acetaminophen (TYLENOL) 500 MG tablet, Take 2 tablets (1,000 mg total) by mouth every 6 (six) hours. (Patient not taking: Reported on 02/16/2019), Disp: 30 tablet, Rfl: 0 .  ferrous sulfate 325 (65 FE) MG tablet, Take 1 tablet (325 mg total) by mouth 2 (two) times daily with a meal. (Patient not taking: Reported on 09/22/2019), Disp: 120 tablet, Rfl: 0 .  ibuprofen (ADVIL,MOTRIN) 600 MG tablet, Take 1 tablet (600 mg total) by mouth every 6 (six) hours. (Patient not taking: Reported on 02/16/2019), Disp: 30 tablet, Rfl: 0 .  levothyroxine (SYNTHROID, LEVOTHROID) 88 MCG tablet, Take 88-176 mcg by mouth See admin instructions. Take 176 mcg by mouth daily on Sunday. Take 88 mcg by mouth daily on all other days., Disp: , Rfl:  .  oxyCODONE (OXY IR/ROXICODONE) 5 MG immediate release tablet, Take  1 tablet (5 mg total) by mouth every 6 (six) hours as needed (pain scale 4-7). (Patient not taking: Reported on 02/16/2019), Disp: 30 tablet, Rfl: 0 .  Prenatal Vit-Fe Fumarate-FA (PRENATAL MULTIVITAMIN) TABS tablet, Take 1 tablet by mouth daily. , Disp: , Rfl:  .  senna-docusate (SENOKOT-S) 8.6-50 MG tablet, Take 2 tablets by mouth daily. (Patient not taking: Reported on 02/16/2019), Disp: , Rfl:   Review of Systems  Constitutional: Negative for appetite change, chills, fatigue and fever.  Respiratory: Negative for chest tightness and shortness of breath.   Cardiovascular: Negative for chest pain and palpitations.  Gastrointestinal: Negative for abdominal pain, nausea and vomiting.  Neurological: Negative for dizziness and weakness.    Social History   Tobacco Use  . Smoking status: Former Research scientist (life sciences)  . Smokeless tobacco: Never Used  Substance Use Topics  . Alcohol use: Not Currently    Alcohol/week: 0.0 standard drinks    Frequency: Never      Objective:   There were no vitals taken for this visit. There were no vitals filed for this visit.There is no height or weight on file to calculate BMI.   Physical Exam Constitutional:      General: She is not in acute distress.    Appearance: Normal appearance. She is not ill-appearing.  Pulmonary:     Effort: Pulmonary effort is normal. No respiratory distress.  Skin:    General: Skin is warm and dry.  Neurological:     Mental Status:  She is alert and oriented to person, place, and time. Mental status is at baseline.  Psychiatric:        Mood and Affect: Mood normal.        Behavior: Behavior normal.      No results found for any visits on 09/22/19.     Assessment & Plan    1. Exposure to COVID-19 virus  Counseled on testing procedures, sx treatments, isolation.   - Novel Coronavirus, NAA ([facility])  I discussed the limitations of evaluation and management by telemedicine and the availability of in person appointments. The  patient expressed understanding and agreed to proceed.  I discussed the assessment and treatment plan with the patient. The patient was provided an opportunity to ask questions and all were answered. The patient agreed with the plan and demonstrated an understanding of the instructions.   The patient was advised to call back or seek an in-person evaluation if the symptoms worsen or if the condition fails to improve as anticipated.  I provided 15 minutes of non-face-to-face time during this encounter.     Trinna Post, PA-C  [facility]

## 2020-04-18 ENCOUNTER — Encounter: Payer: No Typology Code available for payment source | Admitting: Family Medicine

## 2020-04-20 NOTE — Progress Notes (Signed)
Trena Platt Cummings,acting as a scribe for [name], MD.,have documented all relevant documentation on the behalf of [name], MD,as directed by  [name], MD while in the presence of [name], MD.  Complete physical exam   Patient: Alyssa Moore   DOB: Jun 27, 1991   28 y.o. Female  MRN: [id number] Visit Date: 04/25/2020  Today's healthcare provider: [name], MD   Chief Complaint  Patient presents with  . Annual Exam   Subjective    Alyssa Moore is a 29 y.o. female who presents today for a complete physical exam.  She reports consuming a general diet. Home exercise routine includes walking, aerobics and light weights. She generally feels well. She reports sleeping fairly well. She does have additional problems to discuss today.  She is married and has an 78-month-old daughter.  She feels well and has no complaints. HPI   Patient would like to discuss pcp managing hypothyroid instead of going to endocrinology.  She developed hypothyroidism during her pregnancy.  She has had no problem  Past Medical History:  Diagnosis Date  . Hypothyroidism    Past Surgical History:  Procedure Laterality Date  . CESAREAN SECTION N/A 11/03/2018   Procedure: CESAREAN SECTION;  Surgeon: Ward, Honor Loh, MD;  Location: [facility] ORS;  Service: Obstetrics;  Laterality: N/A;  . EYE SURGERY    . FRACTURE SURGERY    . WISDOM TOOTH EXTRACTION     Social History   Socioeconomic History  . Marital status: Married    Spouse name: Not on file  . Number of children: Not on file  . Years of education: Not on file  . Highest education level: Not on file  Occupational History  . Not on file  Tobacco Use  . Smoking status: Former Research scientist (life sciences)  . Smokeless tobacco: Never Used  Substance and Sexual Activity  . Alcohol use: Not Currently    Alcohol/week: 0.0 standard drinks  . Drug use: Never  . Sexual activity: Yes    Birth control/protection: Pill   Other Topics Concern  . Not on file  Social History Narrative  . Not on file   Social Determinants of Health   Financial Resource Strain:   . Difficulty of Paying Living Expenses:   Food Insecurity:   . Worried About Charity fundraiser in the Last Year:   . Arboriculturist in the Last Year:   Transportation Needs:   . Film/video editor (Medical):   Marland Kitchen Lack of Transportation (Non-Medical):   Physical Activity:   . Days of Exercise per Week:   . Minutes of Exercise per Session:   Stress:   . Feeling of Stress :   Social Connections:   . Frequency of Communication with Friends and Family:   . Frequency of Social Gatherings with Friends and Family:   . Attends Religious Services:   . Active Member of Clubs or Organizations:   . Attends Archivist Meetings:   Marland Kitchen Marital Status:   Intimate Partner Violence:   . Fear of Current or Ex-Partner:   . Emotionally Abused:   Marland Kitchen Physically Abused:   . Sexually Abused:    Family Status  Relation Name Status  . Mother  Alive  . Father  Alive  . Brother  Alive  . Brother  Alive   Family History  Problem Relation Age of Onset  . Healthy Mother   . Lymphoma Father   .  Melanoma Father   . Heart failure Father    No Known Allergies  Patient Care Team: Jerrol Banana., MD as PCP - General (Family Medicine)   Medications: Outpatient Medications Prior to Visit  Medication Sig  . levothyroxine (SYNTHROID, LEVOTHROID) 88 MCG tablet Take 88-176 mcg by mouth See admin instructions. Take 176 mcg by mouth daily on Sunday. Take 88 mcg by mouth daily on all other days.  . Norgestimate-Ethinyl Estradiol Triphasic (TRI-SPRINTEC) 0.18/0.215/0.25 MG-35 MCG tablet Take 1 tablet by mouth daily.  . predniSONE (DELTASONE) 20 MG tablet Take 3 tablets (60 mg total) by mouth daily for 5 days.  Marland Kitchen levothyroxine (SYNTHROID) 75 MCG tablet Take one tablet daily.  Marland Kitchen oxyCODONE (OXY IR/ROXICODONE) 5 MG immediate release tablet Take 1  tablet (5 mg total) by mouth every 6 (six) hours as needed (pain scale 4-7). (Patient not taking: Reported on 02/16/2019)   No facility-administered medications prior to visit.    Review of Systems  Constitutional: Negative.   HENT: Negative.   Eyes: Negative.   Respiratory: Negative.   Cardiovascular: Negative.   Gastrointestinal: Negative.   Endocrine: Negative.   Genitourinary: Negative.   Musculoskeletal: Negative.   Skin: Positive for rash.       Poison oak  Allergic/Immunologic: Negative.   Neurological: Negative.   Hematological: Negative.   Psychiatric/Behavioral: Negative.     Last thyroid functions Lab Results  Component Value Date   TSH 6.000 (H) 04/25/2020      Objective    BP 114/68 (BP Location: Left Arm, Patient Position: Sitting, Cuff Size: Normal)   Pulse 60   Temp (!) 97.1 F (36.2 C) (Temporal)   Ht 5\' 3"  (1.6 m)   Wt 140 lb 6.4 oz (63.7 kg)   LMP 03/08/2020   Breastfeeding No   BMI 24.87 kg/m  Wt Readings from Last 3 Encounters:  04/25/20 140 lb 6.4 oz (63.7 kg)  04/21/20 136 lb (61.7 kg)  02/16/19 152 lb (68.9 kg)      Physical Exam Vitals reviewed.  Constitutional:      Appearance: Normal appearance.  HENT:     Head: Normocephalic and atraumatic.     Right Ear: Tympanic membrane and external ear normal.     Left Ear: External ear normal.     Nose: Nose normal.     Mouth/Throat:     Pharynx: Oropharynx is clear.  Eyes:     General: No scleral icterus.    Extraocular Movements: Extraocular movements intact.     Conjunctiva/sclera: Conjunctivae normal.     Pupils: Pupils are equal, round, and reactive to light.  Cardiovascular:     Rate and Rhythm: Regular rhythm.     Heart sounds: Normal heart sounds.  Pulmonary:     Effort: Pulmonary effort is normal.     Breath sounds: Normal breath sounds.  Abdominal:     Palpations: Abdomen is soft.  Musculoskeletal:     Cervical back: Neck supple.     Right lower leg: No edema.     Left  lower leg: No edema.  Lymphadenopathy:     Cervical: No cervical adenopathy.  Skin:    General: Skin is warm and dry.     Coloration: Skin is not jaundiced.  Neurological:     General: No focal deficit present.     Mental Status: She is alert and oriented to person, place, and time. Mental status is at baseline.  Psychiatric:        Mood  and Affect: Mood normal.        Behavior: Behavior normal.        Thought Content: Thought content normal.        Judgment: Judgment normal.       Depression Screen  PHQ 2/9 Scores 02/16/2019  PHQ - 2 Score 0    No results found for any visits on 04/25/20.  Assessment & Plan    Routine Health Maintenance and Physical Exam  Exercise Activities and Dietary recommendations Goals   None      There is no immunization history on file for this patient.  Health Maintenance  Topic Date Due  . COVID-19 Vaccine (1) Never done  . PAP SMEAR-Modifier  Never done  . INFLUENZA VACCINE  07/16/2020  . TETANUS/TDAP  09/22/2028  . HIV Screening  Completed    Discussed health benefits of physical activity, and encouraged her to engage in regular exercise appropriate for her age and condition.  1. Annual physical exam  - Comprehensive metabolic panel - CBC with Differential/Platelet - TSH - POCT urinalysis dipstick  2. Hypothyroidism, unspecified type Turn to clinic 6 months for recheck and TSH - Comprehensive metabolic panel - CBC with Differential/Platelet - TSH - POCT urinalysis dipstick   No follow-ups on file.     I, [name], MD, have reviewed all documentation for this visit. The documentation on 04/30/20 for the exam, diagnosis, procedures, and orders are all accurate and complete.    [place] Bryngelson Cranford Mon, MD  [facility] [phone] (phone) [phone] (fax)  [place]

## 2020-04-21 ENCOUNTER — Encounter: Payer: Self-pay | Admitting: Emergency Medicine

## 2020-04-21 ENCOUNTER — Ambulatory Visit
Admission: EM | Admit: 2020-04-21 | Discharge: 2020-04-21 | Disposition: A | Discharge status: Home / Self Care | Payer: No Typology Code available for payment source | Attending: Nurse Practitioner | Admitting: Nurse Practitioner

## 2020-04-21 ENCOUNTER — Other Ambulatory Visit: Payer: Self-pay

## 2020-04-21 DIAGNOSIS — L247 Irritant contact dermatitis due to plants, except food: Secondary | ICD-10-CM | POA: Diagnosis not present

## 2020-04-21 MED ORDER — METHYLPREDNISOLONE SODIUM SUCC 40 MG IJ SOLR
80.0000 mg | Freq: Once | INTRAMUSCULAR | Status: AC
  Administered 2020-04-21: 80 mg via INTRAMUSCULAR

## 2020-04-21 MED ORDER — PREDNISONE 20 MG PO TABS: 60.0000 mg | ORAL_TABLET | Freq: Every day | ORAL | 0 refills | Status: AC

## 2020-04-21 NOTE — ED Provider Notes (Signed)
[facility]    CSN: [id number] Arrival date & time: 04/21/20  1126      History   Chief Complaint Chief Complaint  Patient presents with  . Rash    HPI Alyssa Moore is a 29 y.o. female.   Subjective:   Alyssa Moore is a 29 y.o. female who presents for evaluation of rash. Rash started 1 day ago. Initial distribution: bilateral arms and face. Lesions are pink in color and are of raised texture. Rash has not changed over time.  Rash is pruritic. Associated symptoms: none. Patient has not had previous evaluation of rash. Patient has not had previous treatment.  Patient has not had contacts with similar rash. Patient has had new exposures. She was working outside. Patient reports that she most likely got into contact with some poison ivy.  Additionally, her dogs play outside and then bring it into the house.  Tried calamine lotion with some improvement.  The following portions of the patient's history were reviewed and updated as appropriate: allergies, current medications, past family history, past medical history, past social history, past surgical history and problem list.        Past Medical History:  Diagnosis Date  . Hypothyroidism     Patient Active Problem List   Diagnosis Date Noted  . Malpresentation before onset of labor 10/19/2018    Past Surgical History:  Procedure Laterality Date  . CESAREAN SECTION N/A 11/03/2018   Procedure: CESAREAN SECTION;  Surgeon: Ward, Honor Loh, MD;  Location: [facility] ORS;  Service: Obstetrics;  Laterality: N/A;  . EYE SURGERY    . FRACTURE SURGERY    . WISDOM TOOTH EXTRACTION      OB History    Gravida  1   Para  1   Term  1   Preterm      AB      Living  1     SAB      TAB      Ectopic      Multiple  0   Live Births  1            Home Medications    Prior to Admission medications   Medication Sig Start Date End Date Taking? Authorizing Provider  levothyroxine (SYNTHROID, LEVOTHROID)  88 MCG tablet Take 88-176 mcg by mouth See admin instructions. Take 176 mcg by mouth daily on Sunday. Take 88 mcg by mouth daily on all other days.   Yes [provider]  Norgestimate-Ethinyl Estradiol Triphasic (TRI-SPRINTEC) 0.18/0.215/0.25 MG-35 MCG tablet Take 1 tablet by mouth daily.   Yes [provider]  levothyroxine (SYNTHROID) 75 MCG tablet Take one tablet daily. 07/05/19   [provider]  oxyCODONE (OXY IR/ROXICODONE) 5 MG immediate release tablet Take 1 tablet (5 mg total) by mouth every 6 (six) hours as needed (pain scale 4-7). Patient not taking: Reported on 02/16/2019 11/05/18   McVey, Murray Hodgkins, CNM  predniSONE (DELTASONE) 20 MG tablet Take 3 tablets (60 mg total) by mouth daily for 5 days. 04/21/20 04/26/20  Enrique Sack, FNP  ferrous sulfate 325 (65 FE) MG tablet Take 1 tablet (325 mg total) by mouth 2 (two) times daily with a meal. Patient not taking: Reported on 09/22/2019 11/05/18 04/21/20  McVey, Murray Hodgkins, CNM    Family History Family History  Problem Relation Age of Onset  . Healthy Mother   . Lymphoma Father   . Melanoma Father   . Heart failure Father  Social History Social History   Tobacco Use  . Smoking status: Former Research scientist (life sciences)  . Smokeless tobacco: Never Used  Substance Use Topics  . Alcohol use: Not Currently    Alcohol/week: 0.0 standard drinks  . Drug use: Never     Allergies   Patient has no known allergies.   Review of Systems Review of Systems  Constitutional: Negative for fever.  HENT: Negative.   Respiratory: Negative.   Gastrointestinal: Negative.   Musculoskeletal: Negative.   Skin: Positive for rash.  All other systems reviewed and are negative.    Physical Exam Triage Vital Signs ED Triage Vitals [04/21/20 1136]  Enc Vitals Group     BP      Pulse      Resp      Temp      Temp src      SpO2      Weight 136 lb (61.7 kg)     Height 5\' 3"  (1.6 m)     Head Circumference      Peak Flow      Pain  Score 0     Pain Loc      Pain Edu?      Excl. in [place]?    No data found.  Updated Vital Signs BP (!) 123/59 (BP Location: Left Arm)   Pulse 62   Temp 98.2 F (36.8 C) (Oral)   Resp 14   Ht 5\' 3"  (1.6 m)   Wt 136 lb (61.7 kg)   LMP 03/24/2020 (Approximate)   SpO2 100%   Breastfeeding No   BMI 24.09 kg/m   Visual Acuity Right Eye Distance:   Left Eye Distance:   Bilateral Distance:    Right Eye Near:   Left Eye Near:    Bilateral Near:     Physical Exam Vitals reviewed.  Constitutional:      General: She is not in acute distress.    Appearance: Normal appearance. She is normal weight. She is not ill-appearing, toxic-appearing or diaphoretic.  HENT:     Head: Normocephalic.  Cardiovascular:     Rate and Rhythm: Normal rate and regular rhythm.  Pulmonary:     Effort: Pulmonary effort is normal.  Musculoskeletal:        General: Normal range of motion.     Cervical back: Normal range of motion.  Skin:    General: Skin is warm and dry.     Findings: Rash present.  Neurological:     General: No focal deficit present.     Mental Status: She is alert.  Psychiatric:        Mood and Affect: Mood normal.      UC Treatments / Results  Labs (all labs ordered are listed, but only abnormal results are displayed) Labs Reviewed - No data to display  EKG   Radiology No results found.  Procedures Procedures (including critical care time)  Medications Ordered in UC Medications  methylPREDNISolone sodium succinate (SOLU-MEDROL) 40 mg/mL injection 80 mg (has no administration in time range)    Initial Impression / Assessment and Plan / UC Course  I have reviewed the triage vital signs and the nursing notes.  Pertinent labs & imaging results that were available during my care of the patient were reviewed by me and considered in my medical decision making (see chart for details).    29 yo female presents for evaluation and treatment for poison ivy rash.  No  fevers.  Afebrile.  Nontoxic.  80 mg of Solu-Medrol given in clinic.  Patient will be placed on a short burst of steroids for the next 5 days.  Reassurance was given to the patient. Skin moisturizer. Watch for signs of fever or worsening of the rash. Benadryl as needed for itching and hand hygiene.  Today's evaluation has revealed no signs of a dangerous process. Discussed diagnosis with patient and/or guardian. Patient and/or guardian aware of their diagnosis, possible red flag symptoms to watch out for and need for close follow up. Patient and/or guardian understands verbal and written discharge instructions. Patient and/or guardian comfortable with plan and disposition.  Patient and/or guardian has a clear mental status at this time, good insight into illness (after discussion and teaching) and has clear judgment to make decisions regarding their care  This care was provided during an unprecedented National Emergency due to the Novel Coronavirus (COVID-19) pandemic. COVID-19 infections and transmission risks place heavy strains on healthcare resources.  As this pandemic evolves, our facility, providers, and staff strive to respond fluidly, to remain operational, and to provide care relative to available resources and information. Outcomes are unpredictable and treatments are without well-defined guidelines. Further, the impact of COVID-19 on all aspects of urgent care, including the impact to patients seeking care for reasons other than COVID-19, is unavoidable during this national emergency. At this time of the global pandemic, management of patients has significantly changed, even for non-COVID positive patients given high local and regional COVID volumes at this time requiring high healthcare system and resource utilization. The standard of care for management of both COVID suspected and non-COVID suspected patients continues to change rapidly at the local, regional, national, and global levels. This  patient was worked up and treated to the best available but ever changing evidence and resources available at this current time.   Documentation was completed with the aid of voice recognition software. Transcription may contain typographical errors.      Final Clinical Impressions(s) / UC Diagnoses   Final diagnoses:  Irritant contact dermatitis due to plants, except food   Discharge Instructions   None    ED Prescriptions    Medication Sig Dispense Auth. Provider   predniSONE (DELTASONE) 20 MG tablet Take 3 tablets (60 mg total) by mouth daily for 5 days. 15 tablet Enrique Sack, FNP     PDMP not reviewed this encounter.   Enrique Sack, [place] 04/21/20 1152

## 2020-04-21 NOTE — ED Triage Notes (Signed)
Patient c/o itchy rash on her arms and face that started last night.  Patient also reports rash and swelling on her left ear.

## 2020-04-25 ENCOUNTER — Encounter: Payer: Self-pay | Admitting: Family Medicine

## 2020-04-25 ENCOUNTER — Ambulatory Visit (INDEPENDENT_AMBULATORY_CARE_PROVIDER_SITE_OTHER): Payer: No Typology Code available for payment source | Admitting: Family Medicine

## 2020-04-25 ENCOUNTER — Other Ambulatory Visit: Payer: Self-pay

## 2020-04-25 VITALS — BP 114/68 | HR 60 | Temp 97.1°F | Ht 63.0 in | Wt 140.4 lb

## 2020-04-25 DIAGNOSIS — E039 Hypothyroidism, unspecified: Secondary | ICD-10-CM

## 2020-04-25 DIAGNOSIS — Z0000 Encounter for general adult medical examination without abnormal findings: Secondary | ICD-10-CM

## 2020-04-25 LAB — POCT URINALYSIS DIPSTICK
Bilirubin, UA: NEGATIVE
Blood, UA: NEGATIVE
Glucose, UA: NEGATIVE
Ketones, UA: NEGATIVE
Leukocytes, UA: NEGATIVE
Nitrite, UA: NEGATIVE
Protein, UA: NEGATIVE
Spec Grav, UA: <=1.005 — AB (ref 1.010–1.025)
Urobilinogen, UA: 0.2 E.U./dL
pH, UA: 7 (ref 5.0–8.0)

## 2020-04-26 LAB — COMPREHENSIVE METABOLIC PANEL
ALT: 14 IU/L (ref 0–32)
AST: 14 IU/L (ref 0–40)
Albumin/Globulin Ratio: 1.6 (ref 1.2–2.2)
Albumin: 3.9 g/dL (ref 3.9–5.0)
Alkaline Phosphatase: 32 IU/L — ABNORMAL LOW (ref 39–117)
BUN/Creatinine Ratio: 16 (ref 9–23)
BUN: 13 mg/dL (ref 6–20)
Bilirubin Total: 0.3 mg/dL (ref 0.0–1.2)
CO2: 21 mmol/L (ref 20–29)
Calcium: 9 mg/dL (ref 8.7–10.2)
Chloride: 104 mmol/L (ref 96–106)
Creatinine, Ser: 0.81 mg/dL (ref 0.57–1.00)
GFR calc Af Amer: 113 mL/min/{1.73_m2} (ref >59)
GFR calc non Af Amer: 98 mL/min/{1.73_m2} (ref >59)
Globulin, Total: 2.4 g/dL (ref 1.5–4.5)
Glucose: 92 mg/dL (ref 65–99)
Potassium: 3.8 mmol/L (ref 3.5–5.2)
Sodium: 138 mmol/L (ref 134–144)
Total Protein: 6.3 g/dL (ref 6.0–8.5)

## 2020-04-26 LAB — CBC WITH DIFFERENTIAL/PLATELET
Basophils Absolute: 0 10*3/uL (ref 0.0–0.2)
Basos: 0 %
EOS (ABSOLUTE): 0.2 10*3/uL (ref 0.0–0.4)
Eos: 2 %
Hematocrit: 33.9 % — ABNORMAL LOW (ref 34.0–46.6)
Hemoglobin: 11.4 g/dL (ref 11.1–15.9)
Immature Grans (Abs): 0 10*3/uL (ref 0.0–0.1)
Immature Granulocytes: 0 %
Lymphocytes Absolute: 1.7 10*3/uL (ref 0.7–3.1)
Lymphs: 15 %
MCH: 32.9 pg (ref 26.6–33.0)
MCHC: 33.6 g/dL (ref 31.5–35.7)
MCV: 98 fL — ABNORMAL HIGH (ref 79–97)
Monocytes Absolute: 0.3 10*3/uL (ref 0.1–0.9)
Monocytes: 3 %
Neutrophils Absolute: 9 10*3/uL — ABNORMAL HIGH (ref 1.4–7.0)
Neutrophils: 80 %
Platelets: 219 10*3/uL (ref 150–450)
RBC: 3.47 x10E6/uL — ABNORMAL LOW (ref 3.77–5.28)
RDW: 11.2 % — ABNORMAL LOW (ref 11.7–15.4)
WBC: 11.3 10*3/uL — ABNORMAL HIGH (ref 3.4–10.8)

## 2020-04-26 LAB — TSH: TSH: 6 u[IU]/mL — ABNORMAL HIGH (ref 0.450–4.500)

## 2020-05-01 ENCOUNTER — Encounter: Payer: Self-pay | Admitting: Family Medicine

## 2020-05-02 ENCOUNTER — Telehealth: Payer: Self-pay

## 2020-05-02 DIAGNOSIS — E039 Hypothyroidism, unspecified: Secondary | ICD-10-CM

## 2020-05-02 DIAGNOSIS — L237 Allergic contact dermatitis due to plants, except food: Secondary | ICD-10-CM

## 2020-05-02 MED ORDER — LEVOTHYROXINE SODIUM 100 MCG PO TABS: 100.0000 ug | ORAL_TABLET | Freq: Every day | ORAL | 2 refills | Status: DC

## 2020-05-02 MED ORDER — PREDNISONE 10 MG PO TABS: ORAL_TABLET | ORAL | 0 refills | Status: DC

## 2020-05-02 NOTE — Telephone Encounter (Signed)
Patient is calling back today to make sure PCP got her message. She will needs to refill her thyroid medication this week and does not want to fill it- if the dosage is going to be adjusted. Also patient states she has gotten a poison oak rash on her hand- possibly from petting dogs- not sure- and wants to know if this can be retreated- she does not want to spread it to other parts of her body.

## 2020-05-02 NOTE — Telephone Encounter (Signed)
Spoke to patient and she said that poison ivy has started to spread so prednisone sent in and refill on Levothyroxine.

## 2020-07-11 ENCOUNTER — Ambulatory Visit: Payer: Self-pay | Admitting: Family Medicine

## 2020-07-11 NOTE — Telephone Encounter (Signed)
Pt reports rash, onset last week noted spreading 2-3 days ago. Pt has appt Thursday but asking for recommendations for OTC meds/creams to use until then.  Reports rash started at right armpit, now extends down side to bra-line and across chest, also noted on "A little area of my upper abdomen and right elbow."  Rash is diffuse, no pustules or papules, pink, "Gets red if I scratch it."  "Feels bumpy though if I rub my hand over it." Itchy 6/10, not painful. HAs applied Lotrimin cream which helped "A little." No fever, no other symptoms.  Pt also questioning if she can send a picture of areas on MyChart for PCPs review. Has active account.  Please advise: [phone] Reason for Disposition . Localized rash present > 7 days    Spreading x 2 days  Answer Assessment - Initial Assessment Questions 1. APPEARANCE of RASH: "Describe the rash."      "Bumpy but do not have a head" 2. LOCATION: "Where is the rash located?"      Right armpit extends down side to under bra line. Also on right elbow. 3. NUMBER: "How many spots are there?"      No spots, diffuse but feels bumpy when rubbed" 4. SIZE: "How big are the spots?" (Inches, centimeters or compare to size of a coin)      NA 5. ONSET: "When did the rash start?"    "Last week"  noted spreading 2 days ago 6. ITCHING: "Does the rash itch?" If Yes, ask: "How bad is the itch?"  (Scale 1-10; or mild, moderate, severe)     Itchy  6/10 7. PAIN: "Does the rash hurt?" If Yes, ask: "How bad is the pain?"  (Scale 1-10; or mild, moderate, severe)     no 8. OTHER SYMPTOMS: "Do you have any other symptoms?" (e.g., fever)     fever 9. PREGNANCY: "Is there any chance you are pregnant?" "When was your last menstrual period?"     no  Protocols used: RASH OR REDNESS - LOCALIZED-A-AH

## 2020-07-11 NOTE — Telephone Encounter (Signed)
I would need to be able to see at the treated. It sounds like it could be urticaria so would start with topical Caladryl and then try Zyrtec 1 daily and Benadryl 25 mg every 6 hours as needed for itching.

## 2020-07-11 NOTE — Telephone Encounter (Signed)
LMOVM for pt to return call 

## 2020-07-12 NOTE — Telephone Encounter (Signed)
Patient called and scheduled appt with Alyssa Moore.

## 2020-07-13 ENCOUNTER — Encounter: Payer: Self-pay | Admitting: Physician Assistant

## 2020-07-13 ENCOUNTER — Ambulatory Visit (INDEPENDENT_AMBULATORY_CARE_PROVIDER_SITE_OTHER): Payer: No Typology Code available for payment source | Admitting: Physician Assistant

## 2020-07-13 ENCOUNTER — Other Ambulatory Visit: Payer: Self-pay

## 2020-07-13 VITALS — BP 112/70 | HR 97 | Temp 96.6°F | Wt 135.0 lb

## 2020-07-13 DIAGNOSIS — R21 Rash and other nonspecific skin eruption: Secondary | ICD-10-CM

## 2020-07-13 MED ORDER — PREDNISONE 10 MG PO TABS: ORAL_TABLET | ORAL | 0 refills | Status: AC

## 2020-07-13 NOTE — Progress Notes (Signed)
I,Shon Mansouri E Suriya Kovarik,acting as a Education administrator for [employer], PA-C.,have documented all relevant documentation on the behalf of Trinna Post, PA-C,as directed by  Trinna Post, PA-C while in the presence of Trinna Post, PA-C.  Established patient visit   Patient: Alyssa Moore   DOB: 1991-11-28   29 y.o. Female  MRN: [id number] Visit Date: 07/13/2020  Today's healthcare provider: Trinna Post, PA-C   Chief Complaint  Patient presents with  . Rash   Subjective    Rash This is a new problem. The current episode started in the past 7 days. The problem has been gradually worsening since onset. The rash is diffuse. The rash is characterized by redness and itchiness. She was exposed to nothing. Pertinent negatives include no anorexia, congestion, facial edema, shortness of breath or sore throat. Past treatments include antihistamine and topical steroids. The treatment provided mild relief.         Medications: Outpatient Medications Prior to Visit  Medication Sig  . levothyroxine (SYNTHROID) 100 MCG tablet Take 1 tablet (100 mcg total) by mouth daily. Take 1 tablet by mouth daily.  . Norgestimate-Ethinyl Estradiol Triphasic (TRI-SPRINTEC) 0.18/0.215/0.25 MG-35 MCG tablet Take 1 tablet by mouth daily.  Marland Kitchen levothyroxine (SYNTHROID) 75 MCG tablet Take one tablet daily.  Marland Kitchen oxyCODONE (OXY IR/ROXICODONE) 5 MG immediate release tablet Take 1 tablet (5 mg total) by mouth every 6 (six) hours as needed (pain scale 4-7). (Patient not taking: Reported on 02/16/2019)  . predniSONE (DELTASONE) 10 MG tablet 6 day taper -6 -5 -4 -3 -2 -1   No facility-administered medications prior to visit.    Review of Systems  Constitutional: Negative.   HENT: Negative for congestion and sore throat.   Respiratory: Negative.  Negative for shortness of breath.   Cardiovascular: Negative.   Gastrointestinal: Negative for anorexia.  Skin: Positive for rash.      Objective    BP 112/70 (BP  Location: Left Arm, Patient Position: Sitting, Cuff Size: Normal)   Pulse 97   Temp (!) 96.6 F (35.9 C) (Temporal)   Wt 135 lb (61.2 kg)   SpO2 99%   BMI 23.91 kg/m    Physical Exam Constitutional:      Appearance: Normal appearance.  Skin:    General: Skin is warm.     Findings: Rash present.          Comments: Non scaling erythematous plaque like lesions on extensor surfaces of bilateral elbows.   Neurological:     Mental Status: She is alert and oriented to person, place, and time. Mental status is at baseline.  Psychiatric:        Mood and Affect: Mood normal.        Behavior: Behavior normal.        Thought Content: Thought content normal.        Judgment: Judgment normal.       No results found for any visits on 07/13/20.  Assessment & Plan    1. Rash and nonspecific skin eruption  Possibly allergic reaction. Could be psoriasis, though this is not a typical presentation. Cannot rule out fungal etiology. Will treat for allergy and test as below. If rash is worsening should stop prednisone and call back. Follow up with derm, she is established with Dr. Aubery Lapping, if worsening.   - predniSONE (DELTASONE) 10 MG tablet; Take 6 tablets (60 mg total) by mouth daily with breakfast for 1 day, THEN 5 tablets (50  mg total) daily with breakfast for 1 day, THEN 4 tablets (40 mg total) daily with breakfast for 1 day, THEN 3 tablets (30 mg total) daily with breakfast for 1 day, THEN 2 tablets (20 mg total) daily with breakfast for 1 day, THEN 1 tablet (10 mg total) daily with breakfast for 1 day.  Dispense: 21 tablet; Refill: 0 - Alpha-Gal Panel    No follow-ups on file.      ITrinna Post, PA-C, have reviewed all documentation for this visit. The documentation on 07/14/20 for the exam, diagnosis, procedures, and orders are all accurate and complete.    Paulene Floor  [facility] [phone] (phone) [phone] (fax)  [place]

## 2020-07-20 LAB — ALPHA-GAL PANEL
Alpha Gal IgE*: <0.1 kU/L (ref <0.10)
Beef (Bos spp) IgE: <0.1 kU/L (ref <0.35)
Class Interpretation: 0
Class Interpretation: 0
Class Interpretation: 0
Lamb/Mutton (Ovis spp) IgE: <0.1 kU/L (ref <0.35)
Pork (Sus spp) IgE: <0.1 kU/L (ref <0.35)

## 2020-10-11 ENCOUNTER — Other Ambulatory Visit: Payer: Self-pay | Admitting: Certified Nurse Midwife

## 2020-10-31 ENCOUNTER — Ambulatory Visit (INDEPENDENT_AMBULATORY_CARE_PROVIDER_SITE_OTHER): Payer: No Typology Code available for payment source | Admitting: Family Medicine

## 2020-10-31 ENCOUNTER — Other Ambulatory Visit: Payer: Self-pay

## 2020-10-31 ENCOUNTER — Encounter: Payer: Self-pay | Admitting: Family Medicine

## 2020-10-31 VITALS — BP 97/61 | HR 61 | Temp 98.6°F | Resp 16 | Ht 63.0 in | Wt 142.0 lb

## 2020-10-31 DIAGNOSIS — E039 Hypothyroidism, unspecified: Secondary | ICD-10-CM

## 2020-10-31 NOTE — Progress Notes (Signed)
I,April Miller,acting as a scribe for [name], MD.,have documented all relevant documentation on the behalf of [name], MD,as directed by  [name], MD while in the presence of [name], MD.   Established patient visit   Patient: Alyssa Moore   DOB: 1991-01-19   29 y.o. Female  MRN: [id number] Visit Date: 10/31/2020  Today's healthcare provider: [name], MD   Chief Complaint  Patient presents with  . Follow-up  . Hypothyroidism   Subjective    HPI  Patient feeling well and has no complaints.  She and her husband are considering trying to have a second child. Hypothyroid, follow-up  Lab Results  Component Value Date   TSH 6.000 (H) 04/25/2020   Wt Readings from Last 3 Encounters:  10/31/20 142 lb (64.4 kg)  07/13/20 135 lb (61.2 kg)  04/25/20 140 lb 6.4 oz (63.7 kg)    She was last seen for hypothyroid 6 months ago.  Management since that visit includes; labs checked-no changes.  She reports good compliance with treatment. She is not having side effects. none  --------------------------------------------------------------------   Past Medical History:  Diagnosis Date  . Hypothyroidism        Medications: Outpatient Medications Prior to Visit  Medication Sig  . levothyroxine (SYNTHROID) 100 MCG tablet Take 1 tablet (100 mcg total) by mouth daily. Take 1 tablet by mouth daily.  . Norgestimate-Ethinyl Estradiol Triphasic (TRI-SPRINTEC) 0.18/0.215/0.25 MG-35 MCG tablet Take 1 tablet by mouth daily.  Marland Kitchen levothyroxine (SYNTHROID) 75 MCG tablet Take one tablet daily. (Patient not taking: Reported on 10/31/2020)  . oxyCODONE (OXY IR/ROXICODONE) 5 MG immediate release tablet Take 1 tablet (5 mg total) by mouth every 6 (six) hours as needed (pain scale 4-7). (Patient not taking: Reported on 02/16/2019)  . predniSONE (DELTASONE) 10 MG tablet 6 day taper -6 -5 -4 -3 -2 -1 (Patient not taking: Reported on 10/31/2020)    No facility-administered medications prior to visit.    Review of Systems  Constitutional: Negative for appetite change, chills, fatigue and fever.  Respiratory: Negative for chest tightness and shortness of breath.   Cardiovascular: Negative for chest pain and palpitations.  Gastrointestinal: Negative for abdominal pain, nausea and vomiting.  Neurological: Negative for dizziness and weakness.    Last thyroid functions Lab Results  Component Value Date   TSH 6.000 (H) 04/25/2020      Objective    BP 97/61 (BP Location: Left Arm, Patient Position: Sitting, Cuff Size: Normal)   Pulse 61   Temp 98.6 F (37 C) (Oral)   Resp 16   Ht 5\' 3"  (1.6 m)   Wt 142 lb (64.4 kg)   SpO2 100%   BMI 25.15 kg/m  BP Readings from Last 3 Encounters:  10/31/20 97/61  07/13/20 112/70  04/25/20 114/68   Wt Readings from Last 3 Encounters:  10/31/20 142 lb (64.4 kg)  07/13/20 135 lb (61.2 kg)  04/25/20 140 lb 6.4 oz (63.7 kg)      Physical Exam Vitals reviewed.  Constitutional:      Appearance: Normal appearance.  HENT:     Head: Normocephalic and atraumatic.     Right Ear: Tympanic membrane and external ear normal.     Left Ear: External ear normal.     Nose: Nose normal.     Mouth/Throat:     Pharynx: Oropharynx is clear.  Eyes:     General: No scleral icterus.    Extraocular Movements:  Extraocular movements intact.     Conjunctiva/sclera: Conjunctivae normal.     Pupils: Pupils are equal, round, and reactive to light.  Cardiovascular:     Rate and Rhythm: Regular rhythm.     Heart sounds: Normal heart sounds.  Pulmonary:     Effort: Pulmonary effort is normal.     Breath sounds: Normal breath sounds.  Abdominal:     Palpations: Abdomen is soft.  Musculoskeletal:     Cervical back: Neck supple.     Right lower leg: No edema.     Left lower leg: No edema.  Lymphadenopathy:     Cervical: No cervical adenopathy.  Skin:    General: Skin is warm and dry.      Coloration: Skin is not jaundiced.  Neurological:     General: No focal deficit present.     Mental Status: She is alert and oriented to person, place, and time.  Psychiatric:        Mood and Affect: Mood normal.        Behavior: Behavior normal.        Thought Content: Thought content normal.        Judgment: Judgment normal.       No results found for any visits on 10/31/20.  Assessment & Plan     1. Hypothyroidism, unspecified type Follow-up in 6 months and then yearly if stable. - TSH   No follow-ups on file.         Janiene Aarons Cranford Mon, MD  [facility] [phone] (phone) [phone] (fax)  [place]

## 2020-11-01 LAB — TSH: TSH: 2.55 u[IU]/mL (ref 0.450–4.500)

## 2020-11-16 ENCOUNTER — Other Ambulatory Visit: Payer: Self-pay | Admitting: Obstetrics & Gynecology

## 2020-11-20 ENCOUNTER — Encounter: Payer: Self-pay | Admitting: Physician Assistant

## 2020-12-26 ENCOUNTER — Other Ambulatory Visit: Payer: Self-pay | Admitting: Dermatology

## 2021-01-26 ENCOUNTER — Other Ambulatory Visit: Payer: Self-pay | Admitting: *Deleted

## 2021-01-26 ENCOUNTER — Encounter: Payer: Self-pay | Admitting: Family Medicine

## 2021-01-26 DIAGNOSIS — E039 Hypothyroidism, unspecified: Secondary | ICD-10-CM

## 2021-01-26 MED ORDER — LEVOTHYROXINE SODIUM 100 MCG PO TABS: 100.0000 ug | ORAL_TABLET | Freq: Every day | ORAL | 2 refills | Status: DC

## 2021-04-16 DIAGNOSIS — Z3490 Encounter for supervision of normal pregnancy, unspecified, unspecified trimester: Secondary | ICD-10-CM | POA: Insufficient documentation

## 2021-04-30 ENCOUNTER — Other Ambulatory Visit (HOSPITAL_COMMUNITY): Payer: Self-pay | Admitting: Obstetrics and Gynecology

## 2021-04-30 ENCOUNTER — Ambulatory Visit: Payer: No Typology Code available for payment source

## 2021-04-30 ENCOUNTER — Other Ambulatory Visit: Payer: Self-pay | Admitting: Obstetrics and Gynecology

## 2021-04-30 ENCOUNTER — Other Ambulatory Visit: Payer: Self-pay

## 2021-04-30 ENCOUNTER — Ambulatory Visit
Admission: RE | Admit: 2021-04-30 | Discharge: 2021-04-30 | Disposition: A | Discharge status: Home / Self Care | Payer: No Typology Code available for payment source | Source: Ambulatory Visit | Attending: Obstetrics and Gynecology | Admitting: Obstetrics and Gynecology

## 2021-04-30 DIAGNOSIS — O200 Threatened abortion: Secondary | ICD-10-CM

## 2021-05-01 ENCOUNTER — Other Ambulatory Visit: Payer: Self-pay | Admitting: Obstetrics and Gynecology

## 2021-05-01 DIAGNOSIS — O200 Threatened abortion: Secondary | ICD-10-CM

## 2021-05-02 ENCOUNTER — Ambulatory Visit: Payer: Self-pay | Admitting: Family Medicine

## 2021-05-04 ENCOUNTER — Other Ambulatory Visit: Payer: Self-pay | Admitting: Obstetrics and Gynecology

## 2021-05-07 ENCOUNTER — Other Ambulatory Visit: Payer: Self-pay

## 2021-05-07 ENCOUNTER — Ambulatory Visit: Payer: No Typology Code available for payment source | Admitting: Anesthesiology

## 2021-05-07 ENCOUNTER — Ambulatory Visit
Admission: RE | Admit: 2021-05-07 | Discharge: 2021-05-07 | Disposition: A | Discharge status: Home / Self Care | Payer: No Typology Code available for payment source | Attending: Obstetrics and Gynecology | Admitting: Obstetrics and Gynecology

## 2021-05-07 ENCOUNTER — Encounter: Payer: Self-pay | Admitting: Obstetrics and Gynecology

## 2021-05-07 ENCOUNTER — Encounter
Admission: RE | Disposition: A | Discharge status: Home / Self Care | Payer: Self-pay | Source: Home / Self Care | Attending: Obstetrics and Gynecology

## 2021-05-07 DIAGNOSIS — Z79890 Hormone replacement therapy (postmenopausal): Secondary | ICD-10-CM | POA: Insufficient documentation

## 2021-05-07 DIAGNOSIS — Z87891 Personal history of nicotine dependence: Secondary | ICD-10-CM | POA: Diagnosis not present

## 2021-05-07 DIAGNOSIS — O021 Missed abortion: Secondary | ICD-10-CM | POA: Diagnosis not present

## 2021-05-07 HISTORY — PX: DILATION AND CURETTAGE OF UTERUS: SHX78

## 2021-05-07 LAB — CBC
HCT: 31.6 % — ABNORMAL LOW (ref 36.0–46.0)
Hemoglobin: 11 g/dL — ABNORMAL LOW (ref 12.0–15.0)
MCH: 32.5 pg (ref 26.0–34.0)
MCHC: 34.8 g/dL (ref 30.0–36.0)
MCV: 93.5 fL (ref 80.0–100.0)
Platelets: 191 10*3/uL (ref 150–400)
RBC: 3.38 MIL/uL — ABNORMAL LOW (ref 3.87–5.11)
RDW: 12.3 % (ref 11.5–15.5)
WBC: 11.5 10*3/uL — ABNORMAL HIGH (ref 4.0–10.5)
nRBC: 0.2 % (ref 0.0–0.2)

## 2021-05-07 LAB — TYPE AND SCREEN
ABO/RH(D): O POS
Antibody Screen: NEGATIVE

## 2021-05-07 LAB — BASIC METABOLIC PANEL
Anion gap: 8 (ref 5–15)
BUN: 7 mg/dL (ref 6–20)
CO2: 22 mmol/L (ref 22–32)
Calcium: 8.7 mg/dL — ABNORMAL LOW (ref 8.9–10.3)
Chloride: 104 mmol/L (ref 98–111)
Creatinine, Ser: 0.5 mg/dL (ref 0.44–1.00)
GFR, Estimated: >60 mL/min (ref >60)
Glucose, Bld: 123 mg/dL — ABNORMAL HIGH (ref 70–99)
Potassium: 4.1 mmol/L (ref 3.5–5.1)
Sodium: 134 mmol/L — ABNORMAL LOW (ref 135–145)

## 2021-05-07 SURGERY — DILATION AND CURETTAGE
Anesthesia: General

## 2021-05-07 MED ORDER — FENTANYL CITRATE (PF) 100 MCG/2ML IJ SOLN
INTRAMUSCULAR | Status: DC | PRN
  Administered 2021-05-07: 50 ug via INTRAVENOUS
  Administered 2021-05-07: 25 ug via INTRAVENOUS

## 2021-05-07 MED ORDER — POVIDONE-IODINE 10 % EX SWAB: 2.0000 "application " | Freq: Once | CUTANEOUS | Status: DC

## 2021-05-07 MED ORDER — KETOROLAC TROMETHAMINE 30 MG/ML IJ SOLN
INTRAMUSCULAR | Status: DC | PRN
  Administered 2021-05-07: 30 mg via INTRAVENOUS

## 2021-05-07 MED ORDER — LACTATED RINGERS IV SOLN: INTRAVENOUS | Status: DC

## 2021-05-07 MED ORDER — TRANEXAMIC ACID-NACL 1000-0.7 MG/100ML-% IV SOLN
INTRAVENOUS | Status: DC | PRN
  Administered 2021-05-07: 1000 mg via INTRAVENOUS

## 2021-05-07 MED ORDER — SILVER NITRATE-POT NITRATE 75-25 % EX MISC
CUTANEOUS | Status: DC | PRN
  Administered 2021-05-07: 2

## 2021-05-07 MED ORDER — ONDANSETRON HCL 4 MG/2ML IJ SOLN: 4.0000 mg | Freq: Once | INTRAMUSCULAR | Status: DC | PRN

## 2021-05-07 MED ORDER — MIDAZOLAM HCL 2 MG/2ML IJ SOLN
INTRAMUSCULAR | Status: DC | PRN
  Administered 2021-05-07: 2 mg via INTRAVENOUS

## 2021-05-07 MED ORDER — DEXMEDETOMIDINE (PRECEDEX) IN NS 20 MCG/5ML (4 MCG/ML) IV SYRINGE
PREFILLED_SYRINGE | INTRAVENOUS | Status: DC | PRN
  Administered 2021-05-07: 8 ug via INTRAVENOUS

## 2021-05-07 MED ORDER — ORAL CARE MOUTH RINSE
15.0000 mL | Freq: Once | OROMUCOSAL | Status: AC
Start: 2021-05-07 — End: 2021-05-07

## 2021-05-07 MED ORDER — IBUPROFEN 800 MG PO TABS: 800.0000 mg | ORAL_TABLET | Freq: Three times a day (TID) | ORAL | 0 refills | Status: AC | PRN

## 2021-05-07 MED ORDER — MISOPROSTOL 200 MCG PO TABS
ORAL_TABLET | ORAL | Status: AC
  Filled 2021-05-07: qty 2

## 2021-05-07 MED ORDER — LIDOCAINE HCL (CARDIAC) PF 100 MG/5ML IV SOSY
PREFILLED_SYRINGE | INTRAVENOUS | Status: DC | PRN
  Administered 2021-05-07: 100 mg via INTRAVENOUS

## 2021-05-07 MED ORDER — METHYLERGONOVINE MALEATE 0.2 MG/ML IJ SOLN
INTRAMUSCULAR | Status: AC
  Filled 2021-05-07: qty 1

## 2021-05-07 MED ORDER — CHLORHEXIDINE GLUCONATE 0.12 % MT SOLN: 15.0000 mL | Freq: Once | OROMUCOSAL | Status: AC

## 2021-05-07 MED ORDER — PROPOFOL 10 MG/ML IV BOLUS
INTRAVENOUS | Status: DC | PRN
  Administered 2021-05-07: 150 mg via INTRAVENOUS
  Administered 2021-05-07: 50 mg via INTRAVENOUS

## 2021-05-07 MED ORDER — CHLORHEXIDINE GLUCONATE 0.12 % MT SOLN
OROMUCOSAL | Status: AC
  Administered 2021-05-07: 15 mL via OROMUCOSAL
  Filled 2021-05-07: qty 15

## 2021-05-07 MED ORDER — ACETAMINOPHEN 10 MG/ML IV SOLN
INTRAVENOUS | Status: DC | PRN
  Administered 2021-05-07: 1000 mg via INTRAVENOUS

## 2021-05-07 MED ORDER — DEXAMETHASONE SODIUM PHOSPHATE 10 MG/ML IJ SOLN
INTRAMUSCULAR | Status: DC | PRN
  Administered 2021-05-07: 10 mg via INTRAVENOUS

## 2021-05-07 MED ORDER — FENTANYL CITRATE (PF) 100 MCG/2ML IJ SOLN: 25.0000 ug | INTRAMUSCULAR | Status: DC | PRN

## 2021-05-07 MED ORDER — TRANEXAMIC ACID 1000 MG/10ML IV SOLN
INTRAVENOUS | Status: AC
  Filled 2021-05-07: qty 10

## 2021-05-07 MED ORDER — ONDANSETRON HCL 4 MG/2ML IJ SOLN
INTRAMUSCULAR | Status: DC | PRN
  Administered 2021-05-07: 4 mg via INTRAVENOUS

## 2021-05-07 SURGICAL SUPPLY — 16 items
CATH ROBINSON RED A/P 16FR (CATHETERS) ×2 IMPLANT
COVER WAND RF STERILE (DRAPES) ×2 IMPLANT
GLOVE SURG ENC MOIS LTX SZ7 (GLOVE) ×2 IMPLANT
GLOVE SURG UNDER LTX SZ7.5 (GLOVE) ×2 IMPLANT
GOWN STRL REUS W/ TWL LRG LVL3 (GOWN DISPOSABLE) ×2 IMPLANT
GOWN STRL REUS W/TWL LRG LVL3 (GOWN DISPOSABLE) ×4
KIT TURNOVER CYSTO (KITS) ×2 IMPLANT
MANIFOLD NEPTUNE II (INSTRUMENTS) ×2 IMPLANT
PACK DNC HYST (MISCELLANEOUS) ×2 IMPLANT
PAD OB MATERNITY 4.3X12.25 (PERSONAL CARE ITEMS) ×2 IMPLANT
PAD PREP 24X41 OB/GYN DISP (PERSONAL CARE ITEMS) ×2 IMPLANT
SET BERKELEY SUCTION TUBING (SUCTIONS) ×2 IMPLANT
SOL PREP PVP 2OZ (MISCELLANEOUS) ×2
SOLUTION PREP PVP 2OZ (MISCELLANEOUS) ×1 IMPLANT
VACURETTE 10 RIGID CVD (CANNULA) ×2 IMPLANT
VACURETTE 8 RIGID CVD (CANNULA) ×2 IMPLANT

## 2021-05-07 NOTE — Op Note (Signed)
Operative Report Suction Dilation and Curettage   Indications: Missed abortion, failed medical management   Pre-operative Diagnosis: Missed abortion at 9 weeks  Post-operative Diagnosis: same.  Procedure: 1. Suction D&C  Surgeon: Benjaman Kindler, MD  Assistant(s):  None  Anesthesia: General LMA anesthesia  Anesthesiologist: Gunnar Fusi, MD Anesthesiologist: Gunnar Fusi, MD CRNA: Aline Brochure, CRNA; Allean Found, CRNA  Estimated Blood Loss:  150         Intraoperative medications: txa IV         Total IV Fluids: 1036ml  Urine Output: 26ml         Specimens: Products of conception         Complications:  None; patient tolerated the procedure well.         Disposition: PACU - hemodynamically stable.         Condition: stable  Findings: Uterus measuring 9 weeks; normal cervix, vagina, perineum.   Indication for procedure/Consents: 30 y.o. G2P1001 at approx 9 wks  here for scheduled surgery for the aforementioned diagnoses.  Risks of surgery were discussed with the patient including but not limited to: bleeding which may require transfusion; infection which may require antibiotics; injury to uterus or surrounding organs; intrauterine scarring which may impair future fertility; need for additional procedures including laparotomy or laparoscopy; and other postoperative/anesthesia complications. Written informed consent was obtained.    Procedure Details:   The patient was then taken to the operating room where general anesthesia was administered and was found to be adequate.  After a formal and adequate timeout was performed, she was placed in the dorsal lithotomy position and examined with the above findings. She was then prepped and draped in the sterile manner.   Her bladder was catheterized for an estimated amount of clear, yellow urine. A speculum was then placed in the patient's vagina and a single tooth tenaculum was applied to the anterior lip of the  cervix.    No uterine sounding was performed on this pregnant uterus. Her cervix was serially dilated to accommodate a 10 sized flexible suction curette.  A sharp curettage was then performed until there was a gritty texture in all four quadrants.  The tenaculum was removed from the anterior lip of the cervix and the vaginal speculum was removed after noting good hemostasis. The patient tolerated the procedure well and was taken to the recovery area awake, extubated and in stable condition.  The patient will be discharged to home as per PACU criteria. Routine postoperative instructions given.  She was prescribed Percocet, Ibuprofen and Colace.  She will follow up in the clinic in two weeks for postoperative evaluation.

## 2021-05-07 NOTE — Anesthesia Procedure Notes (Signed)
Procedure Name: LMA Insertion Date/Time: 05/07/2021 12:50 PM Performed by: Allean Found, CRNA Pre-anesthesia Checklist: Patient identified, Emergency Drugs available, Suction available and Patient being monitored Patient Re-evaluated:Patient Re-evaluated prior to induction Oxygen Delivery Method: Circle system utilized Preoxygenation: Pre-oxygenation with 100% oxygen Induction Type: IV induction Ventilation: Mask ventilation without difficulty LMA: LMA inserted LMA Size: 3.5 Number of attempts: 1 Placement Confirmation: positive ETCO2 and breath sounds checked- equal and bilateral Tube secured with: Tape Dental Injury: Teeth and Oropharynx as per pre-operative assessment

## 2021-05-07 NOTE — H&P (Signed)
Consult History and Physical   SERVICE: Gynecology   Patient Name: ALEEHA BOLINE Patient MRN:   [id number]  CC: Missed abortion  HPI: Jasmia SHERBY MONCAYO is a 30 y.o. G2P1001 at approx 9 weeks with spontaneous abortion, failed misoprostol x2. Presenting for suction D&C.   FINDINGS: Intrauterine gestational sac: Single intrauterine gestation Yolk sac: Visualized Embryo: Visualized Cardiac Activity: Not visualized CRL: 23 mm  9 w  0 d         [place] EDC: 12/03/2021 Subchorionic hemorrhage: None visualized. Maternal uterus/adnexae: Ovaries are within normal limits. The left ovary measures 2.9 by 2.7 x 1.9 cm. The right ovary measures 0.2 by 1.9 x 1.2 cm. No significant free fluid  IMPRESSION: Single intrauterine pregnancy with visualized embryo with crown-rump length of 23 mm and no fetal cardiac activity. Findings meet definitive criteria for failed pregnancy. This follows SRU consensus guidelines: Diagnostic Criteria for Nonviable Pregnancy Early in the First Trimester. Alison Stalling J Med [phone].   Electronically Signed By: Donavan Foil M.D. On: 04/30/2021 17:38   Review of Systems: positives in bold GEN:   fevers, chills, weight changes, appetite changes, fatigue, night sweats HEENT:  HA, vision changes, hearing loss, congestion, rhinorrhea, sinus pressure, dysphagia CV:   CP, palpitations PULM:  SOB, cough GI:  abd pain, N/V/D/C GU:  dysuria, urgency, frequency MSK:  arthralgias, myalgias, back pain, swelling SKIN:  rashes, color changes, pallor NEURO:  numbness, weakness, tingling, seizures, dizziness, tremors PSYCH:  depression, anxiety, behavioral problems, confusion  HEME/LYMPH:  easy bruising or bleeding ENDO:  heat/cold intolerance  Past Obstetrical History: OB History    Gravida  1   Para  1   Term  1   Preterm      AB      Living  1     SAB      IAB      Ectopic      Multiple  0   Live Births  1           Past  Gynecologic History: No LMP recorded.   Past Medical History: Past Medical History:  Diagnosis Date  . Hypothyroidism     Past Surgical History:   Past Surgical History:  Procedure Laterality Date  . CESAREAN SECTION N/A 11/03/2018   Procedure: CESAREAN SECTION;  Surgeon: Ward, Honor Loh, MD;  Location: [facility] ORS;  Service: Obstetrics;  Laterality: N/A;  . EYE SURGERY    . FRACTURE SURGERY    . WISDOM TOOTH EXTRACTION      Family History:  family history includes Healthy in her mother; Heart failure in her father; Lymphoma in her father; Melanoma in her father.  Social History:  Social History   Socioeconomic History  . Marital status: Married    Spouse name: Not on file  . Number of children: Not on file  . Years of education: Not on file  . Highest education level: Not on file  Occupational History  . Not on file  Tobacco Use  . Smoking status: Former Research scientist (life sciences)  . Smokeless tobacco: Never Used  Vaping Use  . Vaping Use: Never used  Substance and Sexual Activity  . Alcohol use: Not Currently    Alcohol/week: 0.0 standard drinks  . Drug use: Never  . Sexual activity: Yes    Birth control/protection: Pill  Other Topics Concern  . Not on file  Social History Narrative  . Not on file   Social Determinants of Health   Financial Resource Strain: Not  on file  Food Insecurity: Not on file  Transportation Needs: Not on file  Physical Activity: Not on file  Stress: Not on file  Social Connections: Not on file  Intimate Partner Violence: Not on file    Home Medications:  Medications reconciled in EPIC  No current facility-administered medications on file prior to encounter.   Current Outpatient Medications on File Prior to Encounter  Medication Sig Dispense Refill  . ibuprofen (ADVIL) 600 MG tablet Take 600 mg by mouth every 6 (six) hours as needed (pain).    Marland Kitchen levothyroxine (SYNTHROID) 100 MCG tablet Take 1 tablet (100 mcg total) by mouth daily. Take 1 tablet by  mouth daily. (Patient taking differently: Take 100 mcg by mouth in the morning. Take 1 tablet by mouth daily.) 90 tablet 2  . oxyCODONE-acetaminophen (PERCOCET/ROXICET) 5-325 MG tablet Take 1 tablet by mouth every 4 (four) hours as needed (pain).    . fluconazole (DIFLUCAN) 150 MG tablet TAKE 1 TABLET (150 MG TOTAL) BY MOUTH ONCE FOR 1 DOSE AND 3 DAYS LATER IF NEEDS TAKE OTHER PILL (Patient not taking: Reported on 05/04/2021) 2 tablet 0  . Norgestimate-Ethinyl Estradiol Triphasic 0.18/0.215/0.25 MG-35 MCG tablet TAKE 1 TABLET BY MOUTH ONCE DAILY. (Patient not taking: Reported on 05/04/2021) 84 tablet 4  . triamcinolone (KENALOG) 0.1 % APPLY TO THE AFFECTED AREA(S) 2 TIMES A DAY (Patient not taking: Reported on 05/04/2021) 80 g 1  . [DISCONTINUED] ferrous sulfate 325 (65 FE) MG tablet Take 1 tablet (325 mg total) by mouth 2 (two) times daily with a meal. (Patient not taking: Reported on 09/22/2019) 120 tablet 0    Allergies:  No Known Allergies  Physical Exam:  Temp:  [98.3 F (36.8 C)] 98.3 F (36.8 C) (05/23 1041) Pulse Rate:  [63] 63 (05/23 1041) Resp:  [16] 16 (05/23 1041) BP: (113)/(69) 113/69 (05/23 1041) SpO2:  [100 %] 100 % (05/23 1041) Weight:  [65.8 kg] 65.8 kg (05/23 1041)   General Appearance:  Well developed, well nourished, no acute distress, alert and oriented x3 HEENT:  Normocephalic atraumatic, extraocular movements intact, moist mucous membranes Cardiovascular:  Normal S1/S2, regular rate and rhythm, no murmurs Pulmonary:  clear to auscultation, no wheezes, rales or rhonchi, symmetric air entry, good air exchange Abdomen:  soft, nontender, nondistended, no abnormal masses, no epigastric pain Extremities:  Full range of motion, no pedal edema, 2+ distal pulses, no tenderness Skin:  normal coloration and turgor, no rashes, no suspicious skin lesions noted  Neurologic:  Cranial nerves 2-12 grossly intact, normal muscle tone, strength 5/5 all four extremities Psychiatric:   Normal mood and affect, appropriate, no AH/VH Pelvic:  deferred   Labs/Studies:   CBC and Coags:  Lab Results  Component Value Date   WBC 11.3 (H) 04/25/2020   NEUTOPHILPCT 80 04/25/2020   HGB 11.4 04/25/2020   HCT 33.9 (L) 04/25/2020   MCV 98 (H) 04/25/2020   PLT 219 04/25/2020   CMP:  Lab Results  Component Value Date   NA 138 04/25/2020   K 3.8 04/25/2020   CL 104 04/25/2020   CO2 21 04/25/2020   BUN 13 04/25/2020   CREATININE 0.81 04/25/2020   CREATININE 0.75 02/22/2019   PROT 6.3 04/25/2020   BILITOT 0.3 04/25/2020   ALT 14 04/25/2020   AST 14 04/25/2020   ALKPHOS 32 (L) 04/25/2020   Other Imaging: US OB LESS THAN 14 WEEKS WITH OB TRANSVAGINAL  Result Date: 04/30/2021 CLINICAL DATA:  Threatened abortion in first trimester EXAM:  OBSTETRIC <14 WK [place] AND TRANSVAGINAL OB US TECHNIQUE: Both transabdominal and transvaginal ultrasound examinations were performed for complete evaluation of the gestation as well as the maternal uterus, adnexal regions, and pelvic cul-de-sac. Transvaginal technique was performed to assess early pregnancy. COMPARISON:  None. FINDINGS: Intrauterine gestational sac: Single intrauterine gestation Yolk sac:  Visualized Embryo:  Visualized Cardiac Activity: Not visualized CRL: 23 mm   9 w   0 d                  [place] EDC: 12/03/2021 Subchorionic hemorrhage:  None visualized. Maternal uterus/adnexae: Ovaries are within normal limits. The left ovary measures 2.9 by 2.7 x 1.9 cm. The right ovary measures 0.2 by 1.9 x 1.2 cm. No significant free fluid IMPRESSION: Single intrauterine pregnancy with visualized embryo with crown-rump length of 23 mm and no fetal cardiac activity. Findings meet definitive criteria for failed pregnancy. This follows SRU consensus guidelines: Diagnostic Criteria for Nonviable Pregnancy Early in the First Trimester. Alison Stalling J Med [phone]. Electronically Signed   By: Donavan Foil M.D.   On: 04/30/2021 17:38   Blood type: O  positive  Assessment / Plan:   Sunny K SKOLNICK is a 30 y.o. G2P1001 who presents with SAB, failed medical management.  Spontaneous miscarriage: Causes of spontaneous miscarriage discussed with patient, including prevalence, common causes, and the expectation that this event does not increase the chance that she will miscarry again in the future. Emotional support given.  Pt has opted for: suction D&C  Consents signed today. Risks of surgery were discussed with the patient including but not limited to: bleeding which may require transfusion; infection which may require antibiotics; injury to uterus or surrounding organs; intrauterine scarring which may impair future fertility; need for additional procedures including laparotomy or laparoscopy; and other postoperative/anesthesia complications. Written informed consent was obtained.  This is a scheduled same-day surgery. She will have a postop visit in 2 weeks to review operative findings and pathology.

## 2021-05-07 NOTE — Anesthesia Postprocedure Evaluation (Signed)
Anesthesia Post Note  Patient: Alyssa Moore  Procedure(s) Performed: SUCTION D&C (N/A )  Patient location during evaluation: PACU Anesthesia Type: General Level of consciousness: awake and alert Pain management: pain level controlled Vital Signs Assessment: post-procedure vital signs reviewed and stable Respiratory status: spontaneous breathing and respiratory function stable Cardiovascular status: stable Anesthetic complications: no   No complications documented.   Last Vitals:  Vitals:   05/07/21 1415 05/07/21 1430  BP: 103/63   Pulse: 64 78  Resp: (!) 24 20  Temp:    SpO2: 100% 100%    Last Pain:  Vitals:   05/07/21 1430  TempSrc:   PainSc: 0-No pain                 Aasha Dina K

## 2021-05-07 NOTE — Anesthesia Preprocedure Evaluation (Signed)
Anesthesia Evaluation  Patient identified by MRN, date of birth, ID band Patient awake    Reviewed: Allergy & Precautions, NPO status , Patient's Chart, lab work & pertinent test results  History of Anesthesia Complications Negative for: history of anesthetic complications  Airway Mallampati: I       Dental   Pulmonary neg sleep apnea, neg COPD, Not current smoker, former smoker,           Cardiovascular (-) hypertension(-) Past MI and (-) CHF (-) dysrhythmias (-) Valvular Problems/Murmurs     Neuro/Psych neg Seizures    GI/Hepatic Neg liver ROS, neg GERD  ,  Endo/Other  neg diabetesHypothyroidism   Renal/GU negative Renal ROS     Musculoskeletal   Abdominal   Peds  Hematology   Anesthesia Other Findings   Reproductive/Obstetrics                             Anesthesia Physical Anesthesia Plan  ASA: II  Anesthesia Plan: General   Post-op Pain Management:    Induction: Intravenous  PONV Risk Score and Plan: 3 and Ondansetron and Dexamethasone  Airway Management Planned: LMA  Additional Equipment:   Intra-op Plan:   Post-operative Plan:   Informed Consent: I have reviewed the patients History and Physical, chart, labs and discussed the procedure including the risks, benefits and alternatives for the proposed anesthesia with the patient or authorized representative who has indicated his/her understanding and acceptance.       Plan Discussed with:   Anesthesia Plan Comments:         Anesthesia Quick Evaluation

## 2021-05-07 NOTE — Discharge Instructions (Signed)
Discharge instructions after a Dilation and Curettage  Signs and Symptoms to Report  Call our office at [phone] if you have any of the following:   . Fever over 100.4 degrees or higher . Severe stomach pain not relieved with pain medications . Bright red bleeding that's heavier than a period that does not slow with rest after the first 24 hours . To go the bathroom a lot (frequency), you can't hold your urine (urgency), or it hurts when you empty your bladder (urinate) . Chest pain . Shortness of breath . Pain in the calves of your legs             AMBULATORY SURGERY  DISCHARGE INSTRUCTIONS   1) The drugs that you were given will stay in your system until tomorrow so for the next 24 hours you should not:  A) Drive an automobile B) Make any legal decisions C) Drink any alcoholic beverage   2) You may resume regular meals tomorrow.  Today it is better to start with liquids and gradually work up to solid foods.  You may eat anything you prefer, but it is better to start with liquids, then soup and crackers, and gradually work up to solid foods.   3) Please notify your doctor immediately if you have any unusual bleeding, trouble breathing, redness and pain at the surgery site, drainage, fever, or pain not relieved by medication.    4) Additional Instructions:        Please contact your physician with any problems or Same Day Surgery at [phone], Monday through Friday 6 am to 4 pm, or [place] [facility] number at [phone].            Marland Kitchen Severe nausea and vomiting not relieved with anti-nausea medications . Any concerns  What You Can Expect after Surgery . You may see some pink tinged, bloody fluid. This is normal. You may also have cramping for several days.   Activities after Your Discharge Follow these guidelines to help speed your recovery at home: . Don't drive if you are in pain or taking narcotic pain medicine. You may  drive when you can safely slam on the brakes, turn the wheel forcefully, and rotate your torso comfortably. This is typically 4-7 days. Practice in a parking lot or side street prior to attempting to drive regularly.  . Ask others to help with household chores until you feel up to doing tasks. . Don't do strenuous activities, exercises, or sports like vacuuming, tennis, squash, etc. until your doctor says it is safe to do so. . Walk as you feel able. Rest often since it may take a week or two for your energy level to return to normal.  . You may climb stairs . Avoid constipation:   -Eat fruits, vegetables, and whole grains. Eat small meals as your appetite will take time to return to normal.   -Drink 6 to 8 glasses of water each day unless your doctor has told you to limit your fluids.   -Use a laxative or stool softener as needed if constipation becomes a problem. You may take Miralax, metamucil, Citrucil, Colace, Senekot, FiberCon, etc. If this does not relieve the constipation, try two tablespoons of Milk Of Magnesia every 8 hours until your bowels move.  . You may shower.  . Do not get in a hot tub, swimming pool, etc. until your doctor agrees. . Do not douche, use tampons, or have sex until you stop spotting, usually  about 2 weeks. . Take your pain medicine when you need it. The medicine may not work as well if the pain is bad.  Take the medicines you were taking before surgery. Other medications you might need are pain medications (ibuprofen), medications for constipation (Colace) and nausea medications (Zofran).       Coping with Pregnancy Loss Pregnancy loss can happen any time during a pregnancy. Often the cause is not known. It is rarely because of anything you did. Pregnancy loss in early pregnancy (during the first trimester) is called a miscarriage. This type of pregnancy loss is the most common.   Any pregnancy loss can be devastating. You will need to recover both physically and  emotionally. Most women are able to get pregnant again after a pregnancy loss and deliver a healthy baby.  How to manage emotional recovery  Pregnancy loss is very hard emotionally. You may feel many different emotions while you grieve. You may feel sad and angry. You may also feel guilty. It is normal to have periods of crying. Emotional recovery can take longer than physical recovery. It is different for everyone. Some women may feel back to normal quickly and others take longer. Taking these steps can help you cope:  Remember that it is unlikely you did anything to cause the pregnancy loss.  Share your thoughts and feelings with friends, family, and your partner. Remember that your partner is also recovering emotionally.  Make sure you have a good support system, and do not spend too much time alone.  Meet with a pregnancy loss counselor or join a pregnancy loss support group.  Get enough sleep and eat a healthy diet. Return to regular exercise when you have recovered physically.  Do not use drugs or alcohol to manage your emotions.  Consider seeing a mental health professional to help you recover emotionally.  Ask a friend or loved one to help you decide what to do with any clothing and nursery items you received for your baby.  How to recognize emotional stress It is normal to have emotional stress after a pregnancy loss. But emotional stress that lasts a long time or becomes severe requires treatment. Watch out for these signs of severe emotional stress:  Sadness, anger, or guilt that is not going away and is interfering with your normal activities.  Relationship problems that have occurred or gotten worse since the pregnancy loss.  Signs of depression that last longer than 2 weeks. These may include: ? Sadness. ? Anxiety. ? Hopelessness. ? Loss of interest in activities you enjoy. ? Inability to concentrate. ? Trouble sleeping or sleeping too much. ? Loss of appetite or  overeating. ? Thoughts of death or of hurting yourself. Follow these instructions at home: Medicines  Take over-the-counter and prescription medicines only as told by your health care provider. Activity  Rest at home until your energy level returns. Return to your normal activities as told by your health care provider. Ask your health care provider what activities are safe for you. General instructions  Keep all follow-up visits as told by your health care provider. This is important.  It may be helpful to meet with others who have experienced pregnancy loss. Ask your health care provider about support groups and resources.  To help you and your partner with the process of grieving, talk with your health care provider or seek counseling.  When you are ready, meet with your health care provider to discuss steps to take for a future  pregnancy. Where to find more information  U.S. Department of Health and Programmer, systems on Women's Health: [place]  American Pregnancy Association: www.americanpregnancy.org Contact a health care provider if:  You continue to experience grief, sadness, or lack of motivation for everyday activities, and those feelings do not improve over time.  You are struggling to recover emotionally, especially if you are using alcohol or substances to help. Get help right away if:  You have thoughts of hurting yourself or others. If you ever feel like you may hurt yourself or others, or have thoughts about taking your own life, get help right away. You can go to your nearest emergency department or call:  Your local emergency services (911 in the [place]  A suicide crisis helpline, such as the [facility] at (984)719-2631. This is open 24 hours a day. Summary  Any pregnancy loss can be difficult physically and emotionally.  You may experience many different emotions while you grieve. Emotional recovery can last longer  than physical recovery.  It is normal to have emotional stress after a pregnancy loss. But emotional stress that lasts a long time or becomes severe requires treatment.  See your health care provider if you are struggling emotionally after a pregnancy loss. This information is not intended to replace advice given to you by your health care provider. Make sure you discuss any questions you have with your health care provider. Document Released: 02/12/2018 Document Revised: 02/12/2018 Document Reviewed: 02/12/2018 Elsevier Interactive Patient Education  2019 [employer].

## 2021-05-07 NOTE — Transfer of Care (Signed)
Immediate Anesthesia Transfer of Care Note  Patient: Alyssa Moore  Procedure(s) Performed: SUCTION D&C (N/A )  Patient Location: PACU  Anesthesia Type:General  Level of Consciousness: sedated  Airway & Oxygen Therapy: Patient Spontanous Breathing and Patient connected to face mask oxygen  Post-op Assessment: Report given to RN and Post -op Vital signs reviewed and stable  Post vital signs: Reviewed and stable  Last Vitals:  Vitals Value Taken Time  BP 109/71 05/07/21 1350  Temp 36.2 C 05/07/21 1350  Pulse 141 05/07/21 1352  Resp 17 05/07/21 1352  SpO2 98 % 05/07/21 1352  Vitals shown include unvalidated device data.  Last Pain:  Vitals:   05/07/21 1041  TempSrc: Temporal  PainSc: 0-No pain         Complications: No complications documented.

## 2021-05-08 ENCOUNTER — Encounter: Payer: Self-pay | Admitting: Obstetrics and Gynecology

## 2021-05-08 LAB — SURGICAL PATHOLOGY

## 2021-07-15 ENCOUNTER — Telehealth: Payer: No Typology Code available for payment source | Admitting: Nurse Practitioner

## 2021-07-15 DIAGNOSIS — N911 Secondary amenorrhea: Secondary | ICD-10-CM | POA: Insufficient documentation

## 2021-07-15 DIAGNOSIS — L237 Allergic contact dermatitis due to plants, except food: Secondary | ICD-10-CM | POA: Diagnosis not present

## 2021-07-15 DIAGNOSIS — L089 Local infection of the skin and subcutaneous tissue, unspecified: Secondary | ICD-10-CM

## 2021-07-15 MED ORDER — PREDNISONE 10 MG (21) PO TBPK: ORAL_TABLET | ORAL | 0 refills | Status: DC

## 2021-07-15 MED ORDER — CEPHALEXIN 500 MG PO CAPS: 500.0000 mg | ORAL_CAPSULE | Freq: Three times a day (TID) | ORAL | 0 refills | Status: AC

## 2021-07-15 NOTE — Progress Notes (Signed)
E Visit for Rash  We are sorry that you are not feeling well. Here is how we plan to help!   Because the rash has been present for an extended period of time we are concerned there may be a secondary infection as a component of the rash/irritation from contact with poison ivy.  We are going to start you on a combination of an antibiotic an an oral steroid.   The antibiotic is: Keflex 500 mg three times per day for 7 days  The directions for the prednisone (steroid) taper are: Directions for 6 day taper: Day 1: take 6 tablets with food  Day 2: take 5 tablets with food  Day 3: take 4 tablets with food  Day 4: take 3 tablets with food  Day 5: take 2 tablets with food  Day 6: take 1 tablet with food    HOME CARE:  Take cool showers and avoid direct sunlight. Apply cool compress or wet dressings. Take a bath in an oatmeal bath.  Sprinkle content of one Aveeno packet under running faucet with comfortably warm water.  Bathe for 15-20 minutes, 1-2 times daily.  Pat dry with a towel. Do not rub the rash. Take an antihistamine like Benadryl for widespread rashes that itch.  The adult dose of Benadryl is 25-50 mg by mouth 4 times daily. Caution:  This type of medication may cause sleepiness.  Do not drink alcohol, drive, or operate dangerous machinery while taking antihistamines.  Do not take these medications if you have prostate enlargement.  Read package instructions thoroughly on all medications that you take.  GET HELP RIGHT AWAY IF:  Symptoms don't go away after treatment. Severe itching that persists. If you rash spreads or swells. If you rash begins to smell. If it blisters and opens or develops a yellow-brown crust. You develop a fever. You have a sore throat. You become short of breath.  MAKE SURE YOU:  Understand these instructions. Will watch your condition. Will get help right away if you are not doing well or get worse.  Thank you for choosing an e-visit.  Your  e-visit answers were reviewed by a board certified advanced clinical practitioner to complete your personal care plan. Depending upon the condition, your plan could have included both over the counter or prescription medications.  Please review your pharmacy choice. Make sure the pharmacy is open so you can pick up prescription now. If there is a problem, you may contact your provider through [employer] and have the prescription routed to another pharmacy.  Your safety is important to [place]. If you have drug allergies check your prescription carefully.   For the next 24 hours you can use MyChart to ask questions about today's visit, request a non-urgent call back, or ask for a work or school excuse. You will get an email in the next two days asking about your experience. I hope that your e-visit has been valuable and will speed your recovery.   I spent approximately 10 minutes reviewing the patient's history, current symptoms and coordinating their care today.    Meds ordered this encounter  Medications   cephALEXin (KEFLEX) 500 MG capsule    Sig: Take 1 capsule (500 mg total) by mouth 3 (three) times daily for 7 days.    Dispense:  21 capsule    Refill:  0   predniSONE (STERAPRED UNI-PAK 21 TAB) 10 MG (21) TBPK tablet    Sig: Take 6 tablets on day one, 5 on  day two, 4 on day three, 3 on day four, 2 on day five, and 1 on day six. Take with food.    Dispense:  21 tablet    Refill:  0

## 2021-07-29 ENCOUNTER — Encounter: Payer: Self-pay | Admitting: Emergency Medicine

## 2021-07-29 ENCOUNTER — Other Ambulatory Visit: Payer: Self-pay

## 2021-07-29 ENCOUNTER — Telehealth: Payer: No Typology Code available for payment source | Admitting: Family

## 2021-07-29 ENCOUNTER — Ambulatory Visit
Admission: EM | Admit: 2021-07-29 | Discharge: 2021-07-29 | Disposition: A | Discharge status: Home / Self Care | Payer: No Typology Code available for payment source | Attending: Family Medicine | Admitting: Family Medicine

## 2021-07-29 DIAGNOSIS — Z3490 Encounter for supervision of normal pregnancy, unspecified, unspecified trimester: Secondary | ICD-10-CM

## 2021-07-29 DIAGNOSIS — R21 Rash and other nonspecific skin eruption: Secondary | ICD-10-CM

## 2021-07-29 DIAGNOSIS — L509 Urticaria, unspecified: Secondary | ICD-10-CM | POA: Diagnosis not present

## 2021-07-29 MED ORDER — PREDNISONE 20 MG PO TABS: 40.0000 mg | ORAL_TABLET | Freq: Every day | ORAL | 0 refills | Status: AC

## 2021-07-29 NOTE — ED Provider Notes (Signed)
[facility]    CSN: [id number] Arrival date & time: 07/29/21  1157      History   Chief Complaint Chief Complaint  Patient presents with   Rash    HPI 30 year old female presents with rash.  Patient has a history of urticaria.  Cause has not been determined.  She has previously seen dermatology for this.  She is currently [redacted] weeks pregnant.  She has not taken any medications for the hives.  She has spoken with her OB/GYN regarding corticosteroids.  OB/GYN feels that this is okay if it is necessary.  Associated itching.  Her urticaria is located on the abdomen, chest, axilla and back.  No pain.  No known inciting factor.  No other complaints.  Past Medical History:  Diagnosis Date   Hypothyroidism     Patient Active Problem List   Diagnosis Date Noted   Amenorrhea, secondary 07/15/2021   Supervision of normal pregnancy 04/16/2021   Malpresentation before onset of labor 10/19/2018   Hypothyroidism (acquired) 06/09/2018   Abnormal TSH 02/27/2018    Past Surgical History:  Procedure Laterality Date   CESAREAN SECTION N/A 11/03/2018   Procedure: CESAREAN SECTION;  Surgeon: Ward, Honor Loh, MD;  Location: [facility] ORS;  Service: Obstetrics;  Laterality: N/A;   DILATION AND CURETTAGE OF UTERUS N/A 05/07/2021   Procedure: SUCTION D&C;  Surgeon: Benjaman Kindler, MD;  Location: [facility] ORS;  Service: Gynecology;  Laterality: N/A;   EYE SURGERY     FRACTURE SURGERY     WISDOM TOOTH EXTRACTION      OB History     Gravida  2   Para  1   Term  1   Preterm      AB      Living  1      SAB      IAB      Ectopic      Multiple  0   Live Births  1            Home Medications    Prior to Admission medications   Medication Sig Start Date End Date Taking? Authorizing Provider  levothyroxine (SYNTHROID) 100 MCG tablet Take 1 tablet (100 mcg total) by mouth daily. Take 1 tablet by mouth daily. Patient taking differently: Take 100 mcg by mouth in the  morning. Take 1 tablet by mouth daily. 01/26/21  Yes Jerrol Banana., MD  predniSONE (DELTASONE) 20 MG tablet Take 2 tablets (40 mg total) by mouth daily for 5 days. 07/29/21 08/03/21 Yes Rune Mendez G, DO  ferrous sulfate 325 (65 FE) MG tablet Take 1 tablet (325 mg total) by mouth 2 (two) times daily with a meal. Patient not taking: Reported on 09/22/2019 11/05/18 04/21/20  McVey, Murray Hodgkins, CNM    Family History Family History  Problem Relation Age of Onset   Healthy Mother    Lymphoma Father    Melanoma Father    Heart failure Father     Social History Social History   Tobacco Use   Smoking status: Former   Smokeless tobacco: Never  Scientific laboratory technician Use: Never used  Substance Use Topics   Alcohol use: Not Currently    Alcohol/week: 0.0 standard drinks   Drug use: Never     Allergies   Patient has no known allergies.   Review of Systems Review of Systems  Constitutional: Negative.   Skin:  Positive for rash.   Physical Exam Triage Vital Signs ED  Triage Vitals  Enc Vitals Group     BP 07/29/21 1245 121/74     Pulse Rate 07/29/21 1245 71     Resp 07/29/21 1245 14     Temp 07/29/21 1245 98.2 F (36.8 C)     Temp Source 07/29/21 1245 Oral     SpO2 07/29/21 1245 99 %     Weight 07/29/21 1240 145 lb 1 oz (65.8 kg)     Height 07/29/21 1240 '5\' 3"'$  (1.6 m)     Head Circumference --      Peak Flow --      Pain Score 07/29/21 1240 0     Pain Loc --      Pain Edu? --      Excl. in [place]? --    Updated Vital Signs BP 121/74 (BP Location: Right Arm)   Pulse 71   Temp 98.2 F (36.8 C) (Oral)   Resp 14   Ht '5\' 3"'$  (1.6 m)   Wt 65.8 kg   SpO2 99%   BMI 25.70 kg/m   Visual Acuity Right Eye Distance:   Left Eye Distance:   Bilateral Distance:    Right Eye Near:   Left Eye Near:    Bilateral Near:     Physical Exam Vitals and nursing note reviewed.  Constitutional:      General: She is not in acute distress.    Appearance: Normal appearance. She is not  ill-appearing.  HENT:     Head: Normocephalic and atraumatic.  Eyes:     General:        Right eye: No discharge.        Left eye: No discharge.     Conjunctiva/sclera: Conjunctivae normal.  Pulmonary:     Effort: Pulmonary effort is normal. No respiratory distress.  Skin:    Comments: Abdomen, chest, back, flank with raised erythematous patches consistent with urticaria.  Neurological:     Mental Status: She is alert.  Psychiatric:        Mood and Affect: Mood normal.        Behavior: Behavior normal.     UC Treatments / Results  Labs (all labs ordered are listed, but only abnormal results are displayed) Labs Reviewed - No data to display  EKG   Radiology No results found.  Procedures Procedures (including critical care time)  Medications Ordered in UC Medications - No data to display  Initial Impression / Assessment and Plan / UC Course  I have reviewed the triage vital signs and the nursing notes.  Pertinent labs & imaging results that were available during my care of the patient were reviewed by me and considered in my medical decision making (see chart for details).    30 year old female presents with urticaria.  She has had intermittent urticaria for quite some time.  She has previously been seen by her PCP and by dermatology.  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  Cause unclear at this time.  She is currently [redacted] weeks pregnant.  She has discussed possible steroid taper with her OB/GYN who is okay with this.  I advised her that we should try to avoid prednisone use if possible.  Placing on Zyrtec 10 mg daily and Pepcid 20 mg twice daily.  If she fails to improve or worsens, she may start the prednisone.  Follow-up with OB/GYN.  Final Clinical Impressions(s) / UC Diagnoses   Final diagnoses:  Urticaria     Discharge Instructions  Zyrtec 10 mg daily.  Pepcid 20 mg twice daily.  Prednisone only if you fail to improve.  Take care  Dr. Lacinda Axon     ED Prescriptions     Medication Sig Dispense Auth. Provider   predniSONE (DELTASONE) 20 MG tablet Take 2 tablets (40 mg total) by mouth daily for 5 days. 10 tablet Coral Spikes, DO      PDMP not reviewed this encounter.   Coral Spikes, [place] 07/29/21 1306

## 2021-07-29 NOTE — Progress Notes (Signed)
For the safety of you and your child, I recommend a face to face office visit with a health care provider.  Given you are pregnant, I believe it would be best to been seen in person.   Many mothers need to take medicines during their pregnancy and while nursing.  Almost all medicines pass into the breast milk in small quantities.  Most are generally considered safe for a mother to take but some medicines must be avoided.  After reviewing your E-Visit request, I recommend that you consult your OB/GYN or pediatrician for medical advice in relation to your condition and prescription medications while pregnant or breastfeeding.  NOTE:  There will be NO CHARGE for this eVisit  If you are having a true medical emergency please call 911.    For an urgent face to face visit, [facility] has six urgent care centers for your convenience:     [facility] [place] [facility] [place] Get Driving Directions [id number] [place], [place] [address]    [facility] [place] [facility]) Get Driving Directions [id number] [place], [place] [address]  [facility] [place] ([place]) Get Driving Directions [id number] [address] [place],  [place]  [address]  [facility] Get Driving Directions [id number] [address], [place], [place] [address]   [facility] Get Driving Directions  [id number] [address] [place], [place] [address]   [facility] [place] Get Driving Directions [id number] [address], [place],  [address]  Your MyChart E-visit questionnaire answers were reviewed by a board certified advanced clinical practitioner to complete your personal care plan based on your specific symptoms.  Thank you for using e-Visits.

## 2021-07-29 NOTE — Discharge Instructions (Addendum)
Zyrtec 10 mg daily.  Pepcid 20 mg twice daily.  Prednisone only if you fail to improve.  Take care  Dr. Lacinda Axon

## 2021-07-29 NOTE — ED Triage Notes (Signed)
Patient c/o hives on her abdomen and back that started on Friday.  Patient states that she has broken out in hives 3 times this year and has seen a dermatologist for this.  Patient states that she is about [redacted] weeks pregnant.  Patient states that her OB said Prednisone would be fine for her to take.

## 2021-08-02 ENCOUNTER — Ambulatory Visit: Payer: Self-pay | Admitting: *Deleted

## 2021-08-02 NOTE — Telephone Encounter (Signed)
Reason for Disposition  [1] MODERATE-SEVERE hives persist (i.e., hives interfere with normal activities or work) AND [2] taking antihistamine (e.g., Benadryl, Claritin) > 24 hours  Answer Assessment - Initial Assessment Questions 1. APPEARANCE: "What does the rash look like?"      I'm been dealing with this for a year now.   Every couple of months I break out in hives around my bra area, abd, trunk, arms.   I've seen drs about it and no one can figure it out. This past weekend I broke out went to urgent care.  I'm on prednisone.   I'm leaving for the beach Wed. So I'm concerned.   I may be calling too early.   I'm [redacted] weeks pregnant too.    It's really frustrating that it's continuing to happen.  2. LOCATION: "Where is the rash located?"      Bra area, trunk, back and arms.    I've never seen an allergy dr.   I think I might need to do that.   3. NUMBER: "How many hives are there?"      Itching.   They look awful.    It was so bad.   I've talked with my OB dr about being on the prednisone and it was ok.   Tomorrow will be my last day of the 5 day prednisone.   My whole abd is red. 4. SIZE: "How big are the hives?" (inches, cm, compare to coins) "Do they all look the same or is there lots of variation in shape and size?"      Red 5. ONSET: "When did the hives begin?" (Hours or days ago)      Over the weekend.   Went to urgent care Sunday 6. ITCHING: "Does it itch?" If Yes, ask: "How bad is the itch?"    - MILD: doesn't interfere with normal activities   - MODERATE-SEVERE: interferes with work, school, sleep, or other activities      Moderate 7. RECURRENT PROBLEM: "Have you had hives before?" If Yes, ask: "When was the last time?" and "What happened that time?"      Yes on and off for a year 8. TRIGGERS: "Were you exposed to any new food, plant, cosmetic product or animal just before the hives began?"     I don't know 9. OTHER SYMPTOMS: "Do you have any other symptoms?" (e.g., fever, tongue  swelling, difficulty breathing, abdominal pain)     itching 10. PREGNANCY: "Is there any chance you are pregnant?" "When was your last menstrual period?"       [redacted] weeks pregnant  Protocols used: Hives-A-AH

## 2021-08-02 NOTE — Telephone Encounter (Signed)
Pt called in c/o having "hives" all in her bra area, abd, a little on her back and arms.   She has been having problems with breaking out in this rash on and off for a year now.   She broke out over the weekend and went to the urgent care on Sunday.   She was given a 5 day course of prednisone.   Tomorrow will be her last dose and it hasn't helped much.   She is leaving for the beach on Wed. And was concerned about having the rash.    She is also [redacted] weeks pregnant so "I know I can't take just anything."   She checked with her OB and the prednisone was ok to take.  There are no appts available during the 24 hr timeframe indicated on the protocol.   She has decided to wait until she gets back from the beach and pursue this further.   "I'll call back".   "Maybe I need to be referred to a specialist or something".   "Being pregnant I know this is not a good time for all of this".  She is going to call back when she returns from the beach.  I sent my notes to [facility] for Dr. Alben Spittle information.

## 2021-08-02 NOTE — Telephone Encounter (Signed)
Left message to call back. CRM created.

## 2021-08-14 ENCOUNTER — Encounter: Payer: Self-pay | Admitting: Family Medicine

## 2021-08-21 DIAGNOSIS — O0993 Supervision of high risk pregnancy, unspecified, third trimester: Secondary | ICD-10-CM

## 2021-08-21 HISTORY — DX: Supervision of high risk pregnancy, unspecified, third trimester: O09.93

## 2021-08-23 ENCOUNTER — Other Ambulatory Visit: Payer: Self-pay

## 2021-08-23 ENCOUNTER — Encounter: Payer: Self-pay | Admitting: Family Medicine

## 2021-08-23 ENCOUNTER — Ambulatory Visit (INDEPENDENT_AMBULATORY_CARE_PROVIDER_SITE_OTHER): Payer: No Typology Code available for payment source | Admitting: Family Medicine

## 2021-08-23 VITALS — BP 114/73 | HR 76 | Temp 98.1°F | Resp 16 | Ht 63.0 in | Wt 155.0 lb

## 2021-08-23 DIAGNOSIS — E039 Hypothyroidism, unspecified: Secondary | ICD-10-CM

## 2021-08-23 DIAGNOSIS — L509 Urticaria, unspecified: Secondary | ICD-10-CM | POA: Diagnosis not present

## 2021-08-23 DIAGNOSIS — Z3490 Encounter for supervision of normal pregnancy, unspecified, unspecified trimester: Secondary | ICD-10-CM

## 2021-08-23 MED ORDER — LEVOTHYROXINE SODIUM 150 MCG PO TABS
150.0000 ug | ORAL_TABLET | Freq: Every day | ORAL | 3 refills | Status: DC
  Filled 2021-08-23: qty 90, 90d supply, fill #0

## 2021-08-23 NOTE — Progress Notes (Signed)
I,Alyssa Moore,acting as a scribe for [name], MD.,have documented all relevant documentation on the behalf of [name], MD,as directed by  [name], MD while in the presence of [name], MD.   Established patient visit   Patient: Alyssa Moore   DOB: 28-Aug-1991   30 y.o. Female  MRN: [id number] Visit Date: 08/23/2021  Today's healthcare provider: [name], MD   Chief Complaint  Patient presents with   Rash   Subjective    Rash This is a recurrent problem. The affected locations include the abdomen. The rash is characterized by dryness, redness, burning and itchiness. She was exposed to nothing. Pertinent negatives include no anorexia, congestion, cough, diarrhea, eye pain, facial edema, fatigue, fever, joint pain, nail changes, rhinorrhea, shortness of breath, sore throat or vomiting.    Patient has recurrent hives. Patient had an episode around 1 month ago (07/28/2021). Patient is [redacted] weeks pregnant. Rash is on her abdomen, arms. Rash is red and itchy. Patient states she went to the beach and the salt water seemed to help. Patient did take a round of prednisone that did not work. Rash is somewhat better now.   We also are addressing her thyroid disease in the face of her pregnancy.  She saw endocrinology during her last pregnancy.  Medications: Outpatient Medications Prior to Visit  Medication Sig   Prenatal Vit-Fe Fumarate-FA (PRENATAL PO) Take by mouth daily.   levothyroxine (SYNTHROID) 100 MCG tablet Take 1 tablet (100 mcg total) by mouth daily. Take 1 tablet by mouth daily. (Patient taking differently: Take 100 mcg by mouth in the morning. Take 1 tablet by mouth daily.)   No facility-administered medications prior to visit.    Review of Systems  Constitutional:  Negative for appetite change, chills, fatigue and fever.  HENT:  Negative for congestion, rhinorrhea and sore throat.   Eyes:  Negative for pain.  Respiratory:   Negative for cough, chest tightness and shortness of breath.   Cardiovascular:  Negative for chest pain and palpitations.  Gastrointestinal:  Negative for abdominal pain, anorexia, diarrhea, nausea and vomiting.  Musculoskeletal:  Negative for joint pain.  Skin:  Positive for rash. Negative for nail changes.  Neurological:  Negative for dizziness and weakness.       Objective    BP 114/73 (BP Location: Right Arm, Patient Position: Sitting, Cuff Size: Normal)   Pulse 76   Temp 98.1 F (36.7 C) (Temporal)   Resp 16   Ht '5\' 3"'$  (1.6 m)   Wt 155 lb (70.3 kg)   SpO2 97%   BMI 27.46 kg/m  BP Readings from Last 3 Encounters:  08/23/21 114/73  07/29/21 121/74  05/07/21 111/64   Wt Readings from Last 3 Encounters:  08/23/21 155 lb (70.3 kg)  07/29/21 145 lb 1 oz (65.8 kg)  05/07/21 145 lb (65.8 kg)      Physical Exam Vitals reviewed.  Constitutional:      Appearance: Normal appearance.  HENT:     Head: Normocephalic and atraumatic.     Right Ear: Tympanic membrane and external ear normal.     Left Ear: External ear normal.     Nose: Nose normal.     Mouth/Throat:     Pharynx: Oropharynx is clear.  Eyes:     General: No scleral icterus.    Extraocular Movements: Extraocular movements intact.     Conjunctiva/sclera: Conjunctivae normal.     Pupils: Pupils are equal,  round, and reactive to light.  Cardiovascular:     Rate and Rhythm: Regular rhythm.     Heart sounds: Normal heart sounds.  Pulmonary:     Effort: Pulmonary effort is normal.     Breath sounds: Normal breath sounds.  Abdominal:     Palpations: Abdomen is soft.  Musculoskeletal:     Cervical back: Neck supple.     Right lower leg: No edema.     Left lower leg: No edema.  Lymphadenopathy:     Cervical: No cervical adenopathy.  Skin:    General: Skin is warm and dry.     Coloration: Skin is not jaundiced.  Neurological:     General: No focal deficit present.     Mental Status: She is alert and oriented  to person, place, and time.  Psychiatric:        Mood and Affect: Mood normal.        Behavior: Behavior normal.        Thought Content: Thought content normal.        Judgment: Judgment normal.      No results found for any visits on 08/23/21.  Assessment & Plan     1. Hypothyroidism, unspecified type Increased levothyroxine from 100 mcg to 150 mcg qd. Referred back to Dr. Gabriel Carina.  Last TSH from 2 days ago was 5.14 and patient is [redacted] weeks pregnant. - levothyroxine (SYNTHROID) 150 MCG tablet; Take 1 tablet (150 mcg total) by mouth daily before breakfast.  Dispense: 90 tablet; Refill: 3 - Ambulatory referral to Endocrinology  2. Urticaria  patient is much better.  Discussed using Claritin and Benadryl.  3. Intrauterine pregnancy Followed by OB   No follow-ups on file.      I, [name], MD, have reviewed all documentation for this visit. The documentation on 08/26/21 for the exam, diagnosis, procedures, and orders are all accurate and complete.    Richard Cranford Mon, MD  [facility] [phone] (phone) [phone] (fax)  [place]

## 2021-08-24 ENCOUNTER — Other Ambulatory Visit: Payer: Self-pay

## 2021-08-24 IMAGING — US US OB < 14 WEEKS - US OB TV
1 series · 14 of 28 positions shown · non-contrast
Comparison: None.

CLINICAL DATA: Threatened abortion in first trimester

EXAM:
OBSTETRIC <14 WK US AND TRANSVAGINAL OB US
TECHNIQUE: Both transabdominal and transvaginal ultrasound examinations were
performed for complete evaluation of the gestation as well as the
maternal uterus, adnexal regions, and pelvic cul-de-sac.
Transvaginal technique was performed to assess early pregnancy.

[Series 1: us ob less than 14 weeks with ob transvaginal · 119 acquisitions, 14 frames shown]
[im 5/119]
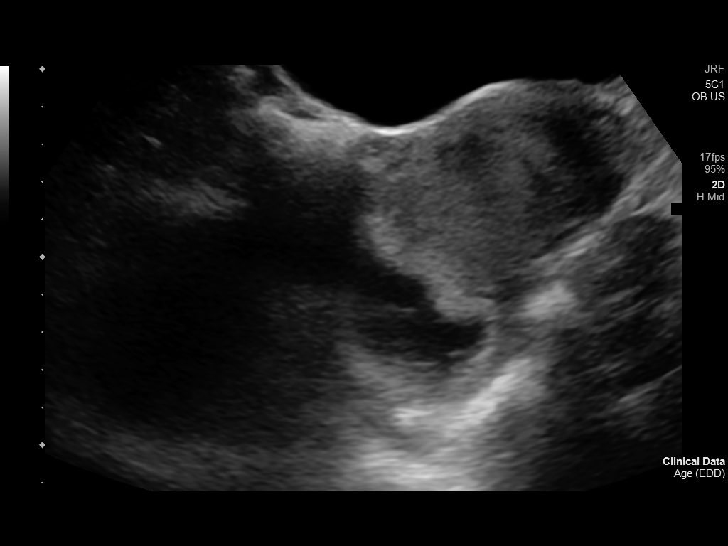
[im 14/119]
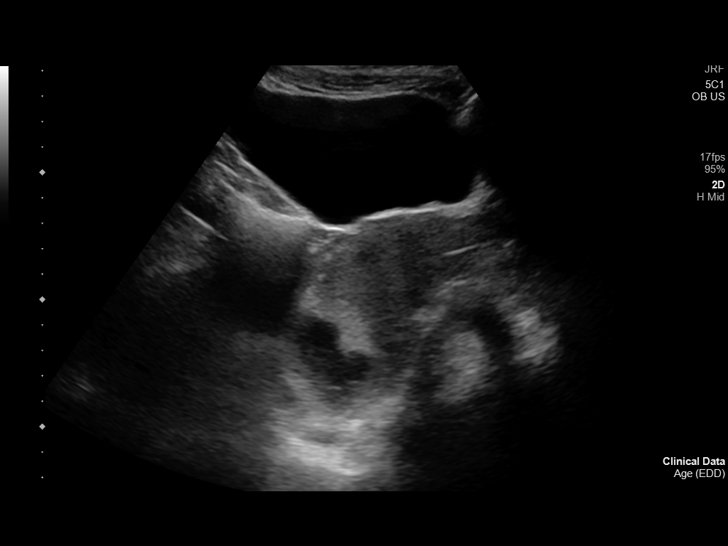
[im 22/119]
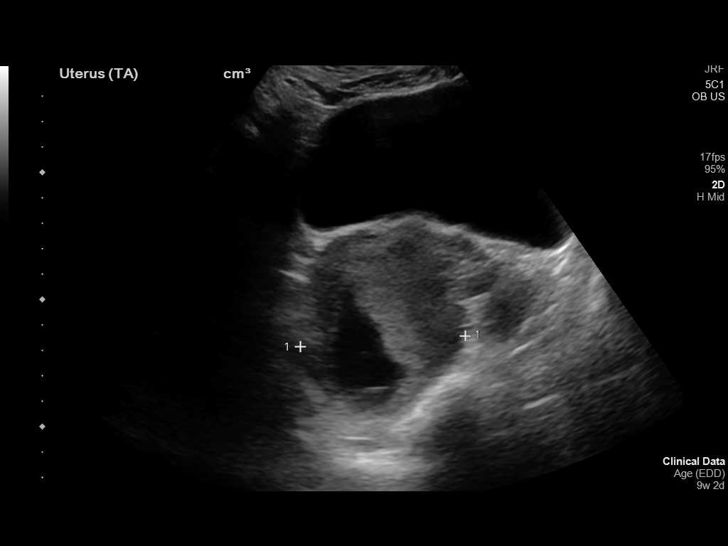
[im 31/119]
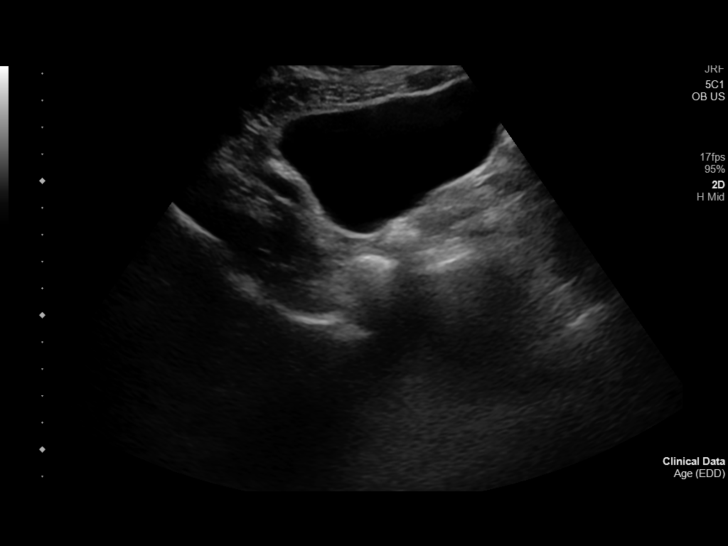
[im 40/119]
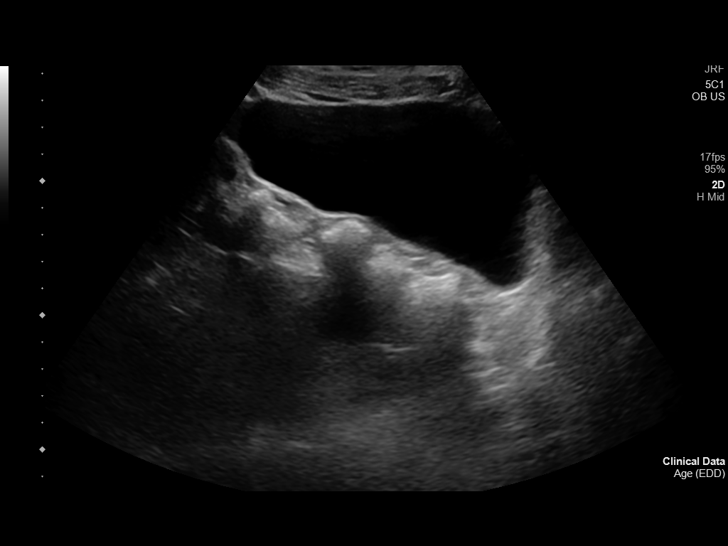
[im 49/119]
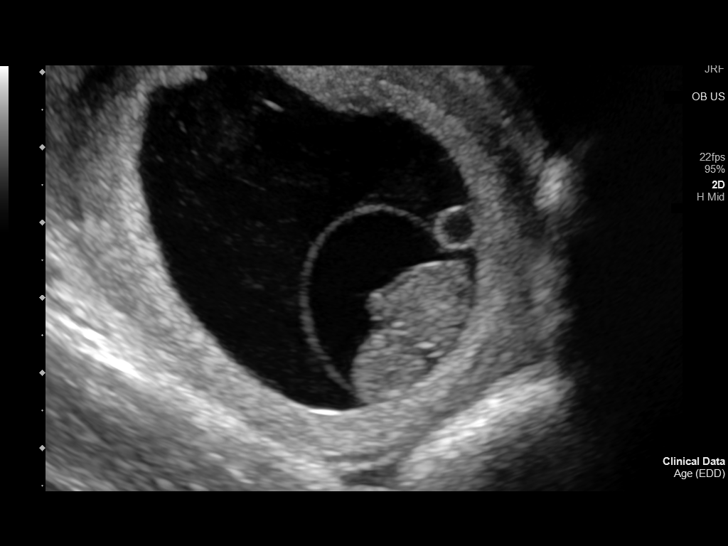
[im 57/119]
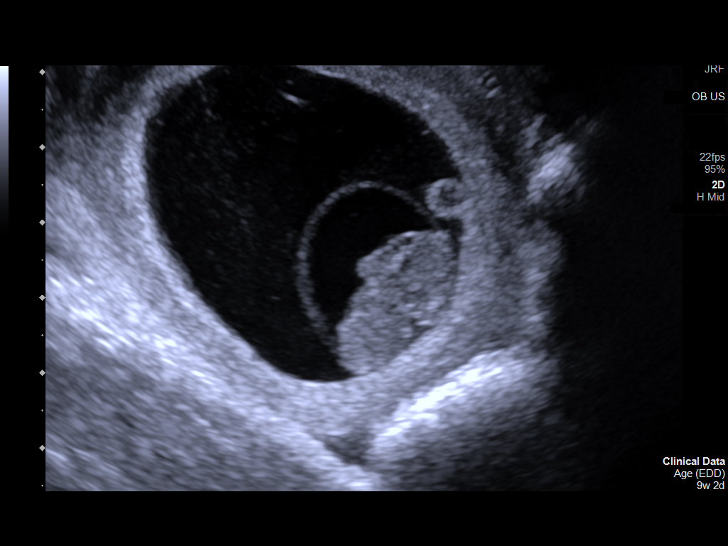
[im 66/119]
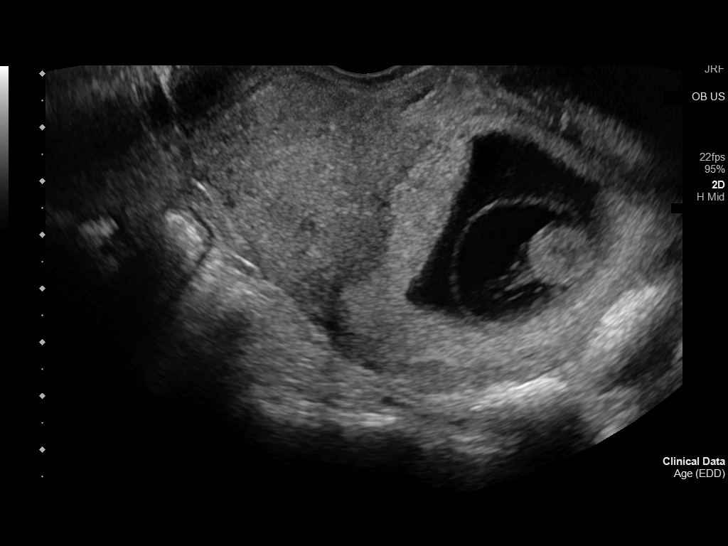
[im 75/119]
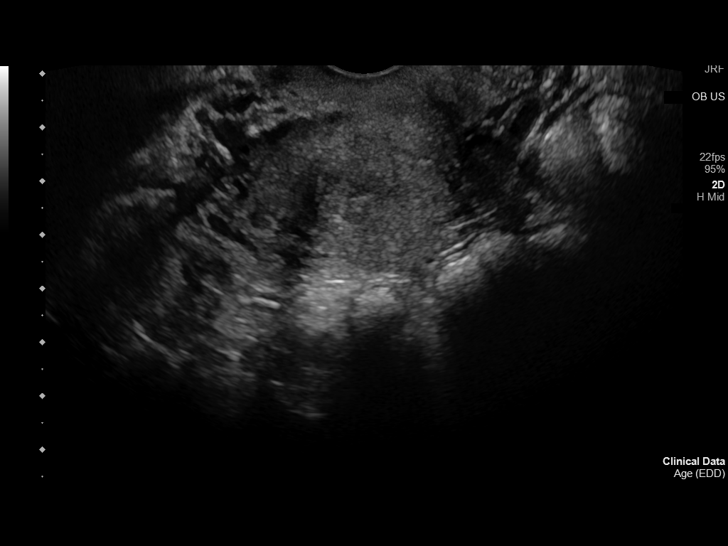
[im 84/119]
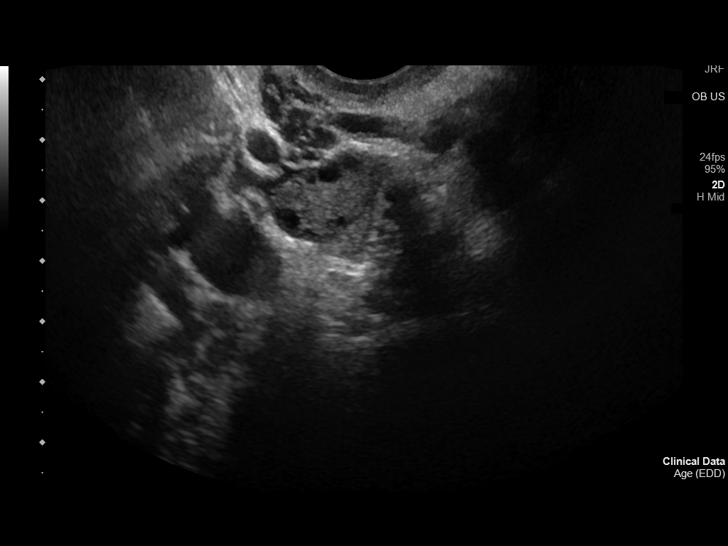
[im 92/119]
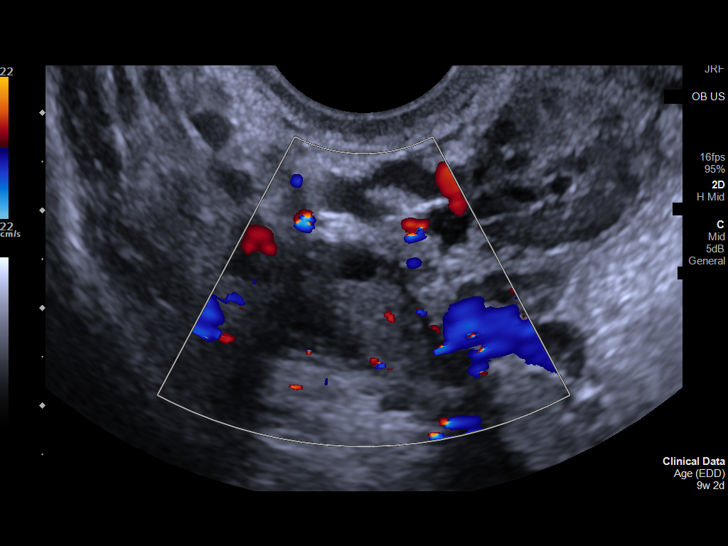
[im 101/119]
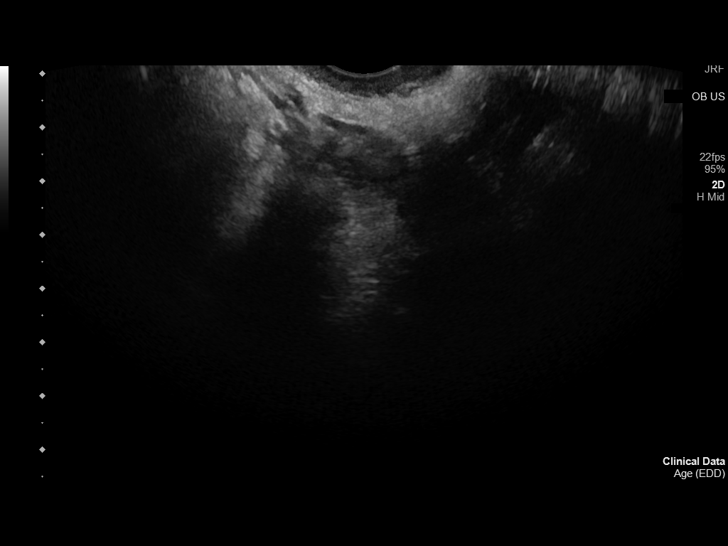
[im 110/119]
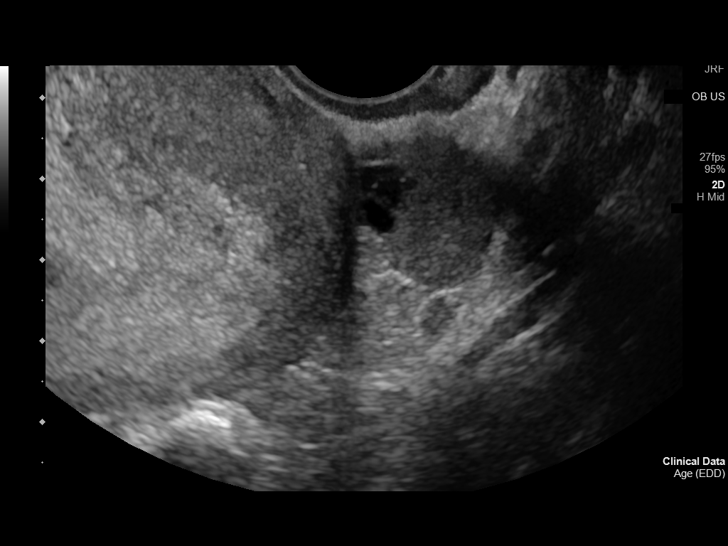
[im 119/119]
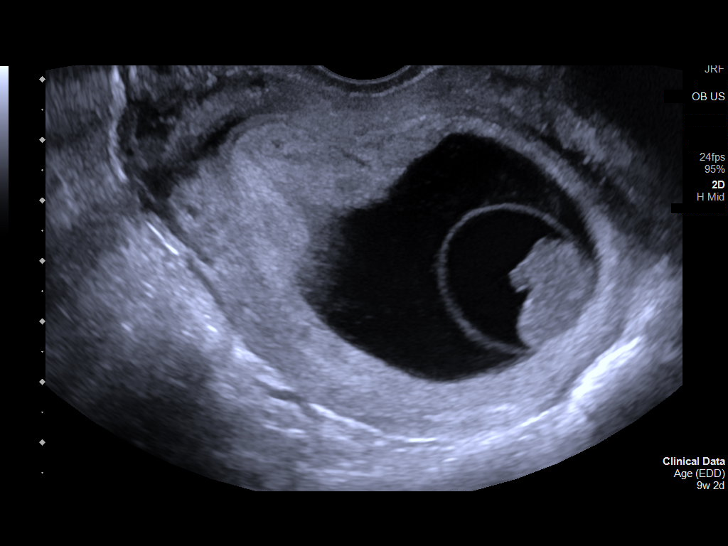

[14 of 28 positions shown; findings below may reference images not displayed]

FINDINGS: Intrauterine gestational sac: Single intrauterine gestation

Yolk sac:  Visualized

Embryo:  Visualized

Cardiac Activity: Not visualized

CRL: 23 mm   9 w   0 d                  US EDC: 12/03/2021

Subchorionic hemorrhage:  None visualized.

Maternal uterus/adnexae: Ovaries are within normal limits. The left
ovary measures 2.9 by 2.7 x 1.9 cm. The right ovary measures 0.2 by
1.9 x 1.2 cm. No significant free fluid
IMPRESSION: Single intrauterine pregnancy with visualized embryo with crown-rump
length of 23 mm and no fetal cardiac activity. Findings meet
definitive criteria for failed pregnancy. This follows SRU consensus
guidelines: Diagnostic Criteria for Nonviable Pregnancy Early in the
First Trimester. N Engl J Med 6741;[DATE].

## 2021-09-13 LAB — OB RESULTS CONSOLE HIV ANTIBODY (ROUTINE TESTING): HIV: NONREACTIVE

## 2021-09-13 LAB — OB RESULTS CONSOLE RUBELLA ANTIBODY, IGM: Rubella: IMMUNE

## 2021-09-13 LAB — OB RESULTS CONSOLE HEPATITIS B SURFACE ANTIGEN: Hepatitis B Surface Ag: NEGATIVE

## 2021-09-27 ENCOUNTER — Other Ambulatory Visit: Payer: Self-pay

## 2021-09-27 MED ORDER — LEVOTHYROXINE SODIUM 137 MCG PO TABS
ORAL_TABLET | ORAL | 1 refills | Status: DC
  Filled 2021-09-27: qty 90, 90d supply, fill #0

## 2021-09-28 ENCOUNTER — Ambulatory Visit
Admission: EM | Admit: 2021-09-28 | Discharge: 2021-09-28 | Disposition: A | Discharge status: Home / Self Care | Payer: No Typology Code available for payment source

## 2021-09-28 ENCOUNTER — Other Ambulatory Visit: Payer: Self-pay

## 2021-09-28 DIAGNOSIS — L237 Allergic contact dermatitis due to plants, except food: Secondary | ICD-10-CM | POA: Diagnosis not present

## 2021-09-28 MED ORDER — PREDNISONE 10 MG PO TABS: 40.0000 mg | ORAL_TABLET | Freq: Every day | ORAL | 0 refills | Status: AC

## 2021-09-28 NOTE — ED Triage Notes (Signed)
Patient presents to Urgent Care with complaints of a possible poison oak rash noted weds. Pt states rash started on the left lower abdominal side and has radiated to facial area. Not treating rash. Has a hx of contact dermatitis.   Denies fever or changes in meds, diet, or product.

## 2021-09-28 NOTE — ED Provider Notes (Signed)
Chief Complaint   Chief Complaint  Patient presents with   Rash     Subjective, HPI  Alyssa Moore is a very pleasant 30 y.o. female who presents with potential poison oak or poison ivy rash for which she noticed on Wednesday.  Patient reports that the rash started to the left lower abdominal region and has now traveled to the neck and the facial area covering the left eyelid.  Patient reports that she has not been treating the rash.  Patient reports a history of contact dermatitis.  No fever, chills, vomiting, changes in medications, diets or products.  History obtained from patient.   Patient's problem list, past medical and social history, medications, and allergies were reviewed by me and updated in Epic.    ROS  See HPI.  Objective   Vitals:   09/28/21 1741  BP: 125/67  Pulse: 78  Resp: 16  Temp: 98.3 F (36.8 C)  SpO2: 98%    Vital signs and nursing note reviewed.  General: Appears well-developed and well-nourished. No acute distress.  HEENT: Normocephalic, atraumatic, hearing grossly intact. EOMI, no drainage. No rhinorrhea. Moist mucous membranes.  Neck: Normal range of motion, neck is supple.  Cardiovascular: Normal rate.  Pulm/Chest: No respiratory distress.   Musculoskeletal: No joint deformity, normal range of motion.  Skin: Erythematous rash mildly excoriated in linear streaks and occasional vesicles noted to left lower abdominal region, left side region, midline neck and left side of face along with left eyelid.  Data  No results found for any visits on 09/28/21.   Assessment & Plan  1. Poison ivy dermatitis - predniSONE (DELTASONE) 10 MG tablet; Take 4 tablets (40 mg total) by mouth daily for 10 days.  Dispense: 40 tablet; Refill: 0  30 y.o. female presents with potential poison oak or poison ivy rash for which she noticed on Wednesday.  Patient reports that the rash started to the left lower abdominal region and has now traveled to the neck and the  facial area covering the left eyelid.  Patient reports that she has not been treating the rash.  Patient reports a history of contact dermatitis.  No fever, chills, vomiting, changes in medications, diets or products.  Given symptoms along with assessment findings, concern for poison ivy dermatitis.  Called and spoke with the OB/GYN on-call Dr. Foy Guadalajara and he advised the use of 40 mg of prednisone for 10 days with no taper along with over-the-counter cream Zanfel which will help with lesions and itching.  Rx'd prednisone to her preferred pharmacy.  The patient is 15 weeks and 2 days pregnant.  Advised that if she continues to have symptoms she will need to follow-up with her gynecology team for further investigation.  Patient verbalized understanding and agreed with plan.  Patient stable upon discharge.  Return as needed.  Plan:   Discharge Instructions      Take prednisone as prescribed.  You may use topical Zanfel as directed on the box over the counter.  If you continue to have symptoms, please follow up with your gynecology team for further evaluation.         Serafina Royals, [place] 09/28/21 1816

## 2021-09-28 NOTE — Discharge Instructions (Addendum)
Take prednisone as prescribed.  You may use topical Zanfel as directed on the box over the counter.  If you continue to have symptoms, please follow up with your gynecology team for further evaluation.

## 2021-10-12 ENCOUNTER — Other Ambulatory Visit: Payer: Self-pay | Admitting: Certified Nurse Midwife

## 2021-10-12 DIAGNOSIS — Z3689 Encounter for other specified antenatal screening: Secondary | ICD-10-CM

## 2021-10-30 ENCOUNTER — Ambulatory Visit
Admission: RE | Admit: 2021-10-30 | Discharge: 2021-10-30 | Disposition: A | Discharge status: Home / Self Care | Payer: No Typology Code available for payment source | Source: Ambulatory Visit | Attending: Certified Nurse Midwife | Admitting: Certified Nurse Midwife

## 2021-10-30 ENCOUNTER — Other Ambulatory Visit: Payer: Self-pay

## 2021-10-30 DIAGNOSIS — Z3689 Encounter for other specified antenatal screening: Secondary | ICD-10-CM | POA: Insufficient documentation

## 2021-11-01 ENCOUNTER — Other Ambulatory Visit: Payer: Self-pay

## 2021-11-01 MED ORDER — LEVOTHYROXINE SODIUM 125 MCG PO TABS: ORAL_TABLET | ORAL | 2 refills | Status: DC

## 2021-11-01 MED ORDER — LEVOTHYROXINE SODIUM 125 MCG PO TABS
ORAL_TABLET | ORAL | 5 refills | Status: DC
  Filled 2021-11-01: qty 30, 30d supply, fill #0
  Filled 2021-12-02: qty 30, 30d supply, fill #1

## 2021-12-02 ENCOUNTER — Other Ambulatory Visit: Payer: Self-pay

## 2021-12-03 ENCOUNTER — Other Ambulatory Visit: Payer: Self-pay

## 2022-01-15 DIAGNOSIS — Z98891 History of uterine scar from previous surgery: Secondary | ICD-10-CM | POA: Insufficient documentation

## 2022-01-15 HISTORY — DX: History of uterine scar from previous surgery: Z98.891

## 2022-01-23 ENCOUNTER — Other Ambulatory Visit: Payer: Self-pay

## 2022-01-23 MED ORDER — LEVOTHYROXINE SODIUM 100 MCG PO TABS
ORAL_TABLET | ORAL | 0 refills | Status: DC
  Filled 2022-01-23: qty 30, 30d supply, fill #0

## 2022-02-13 LAB — OB RESULTS CONSOLE GC/CHLAMYDIA: Chlamydia: NEGATIVE

## 2022-02-23 IMAGING — US US OB COMP +14 WK
1 series · 13 of 28 positions shown · non-contrast
Comparison: none

CLINICAL DATA: Second trimester pregnancy for fetal anatomy survey.

EXAM:
OBSTETRICAL ULTRASOUND >14 WKS

[Series 1: us ob comp + 14 wk · 13 of 103 slices shown]
[im 4/103]
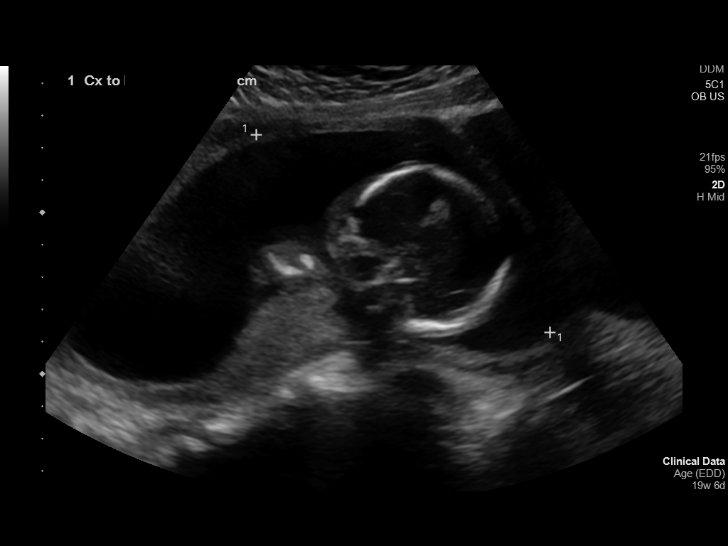
[im 12/103]
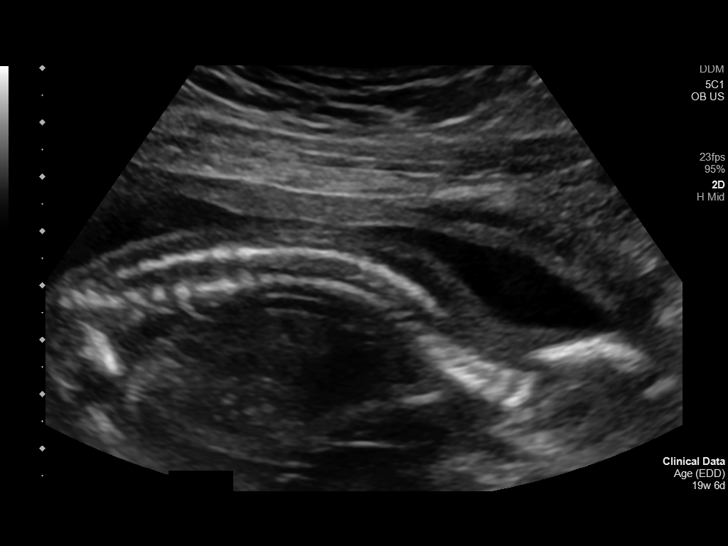
[im 19/103]
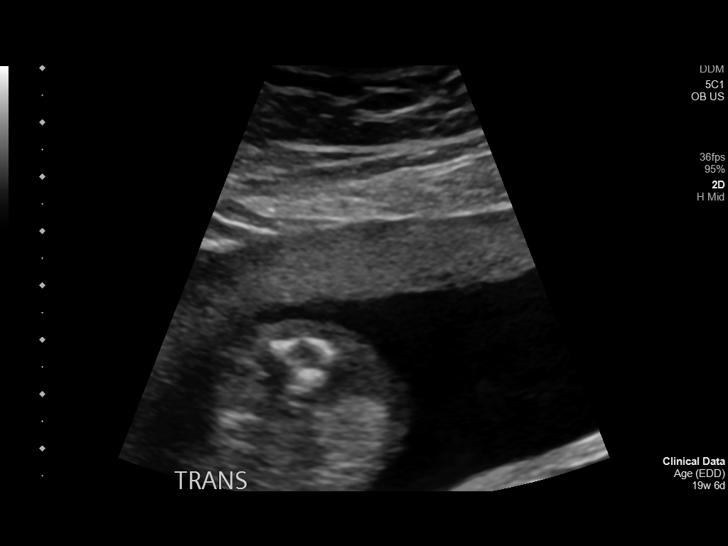
[im 27/103]
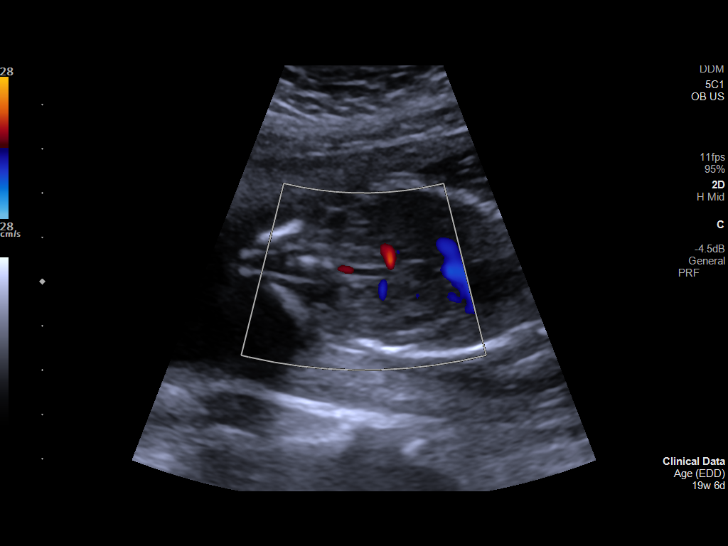
[im 35/103]
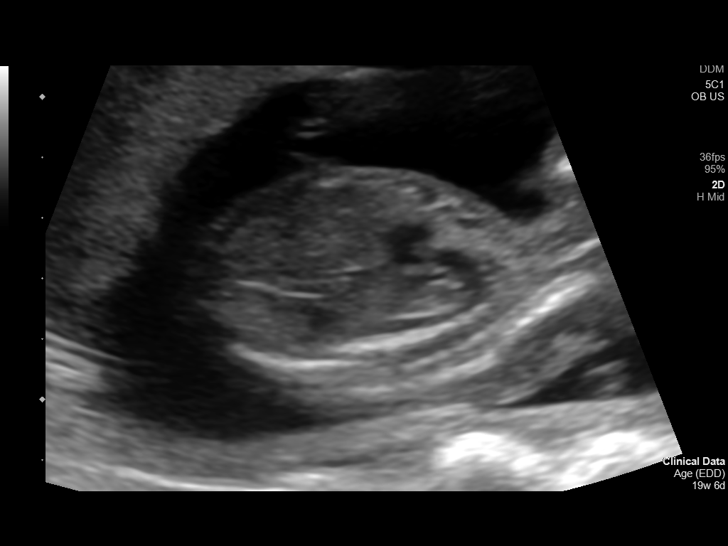
[im 42/103]
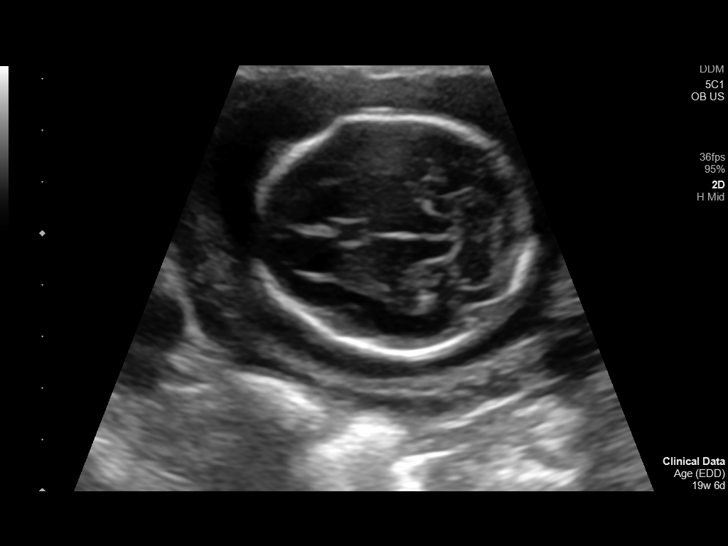
[im 53/103]
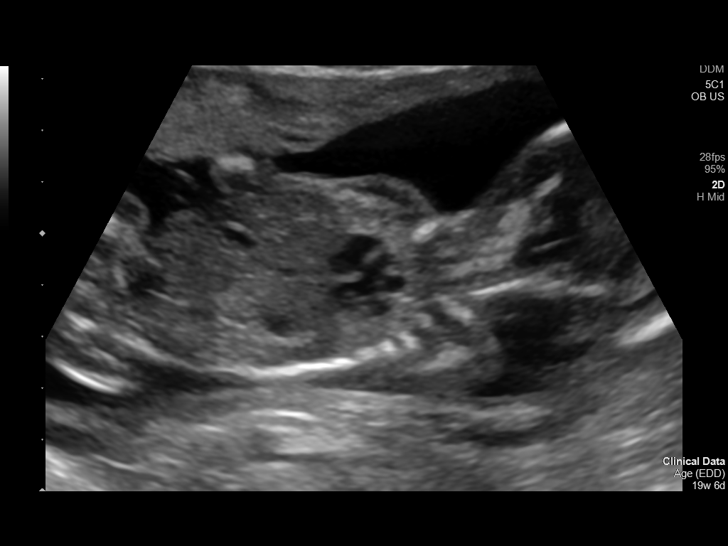
[im 61/103]
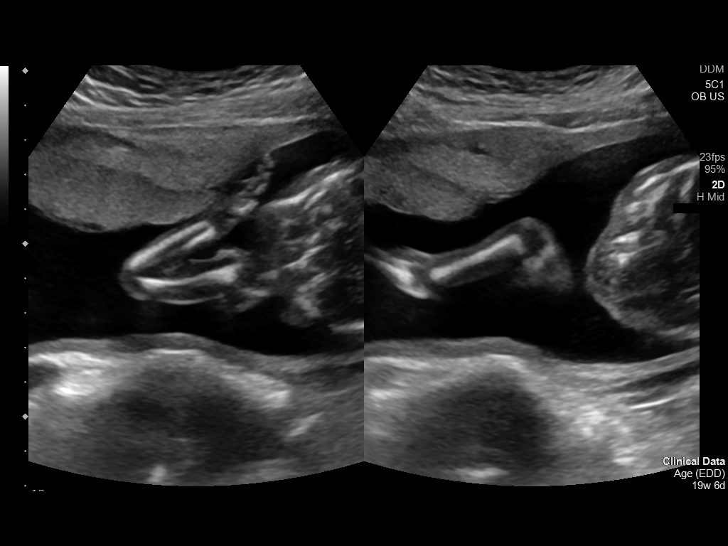
[im 69/103]
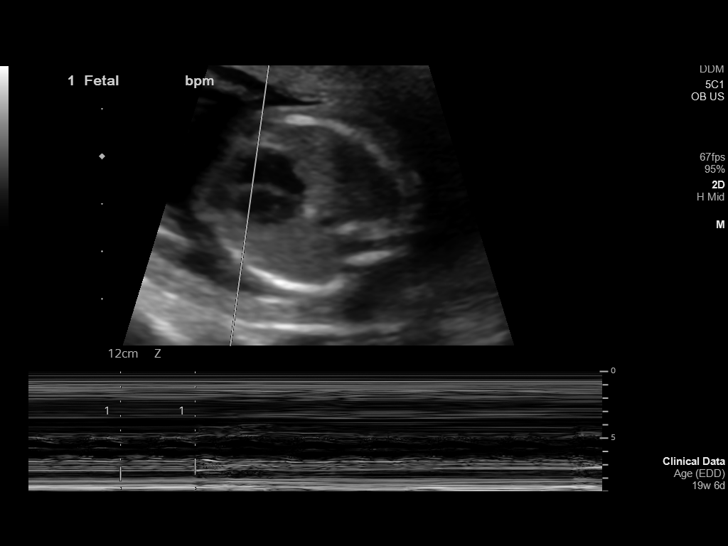
[im 76/103]
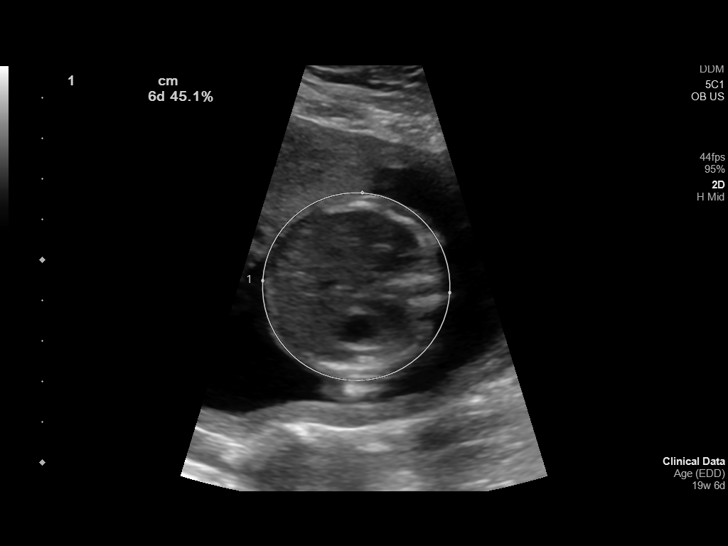
[im 84/103]
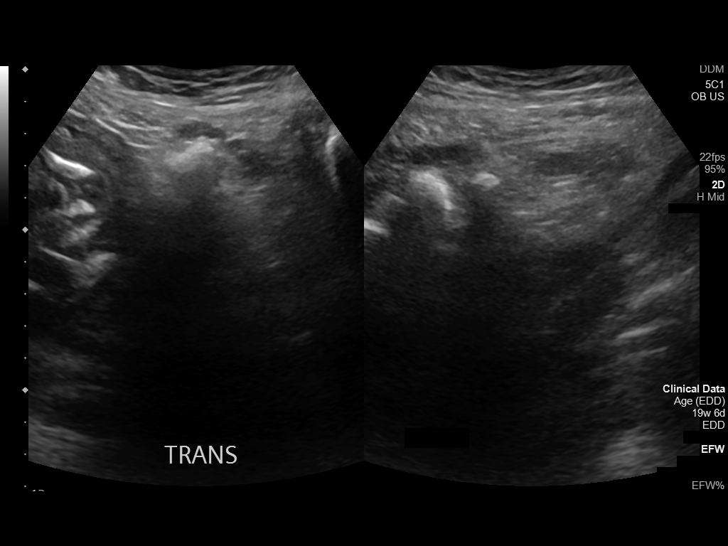
[im 91/103]
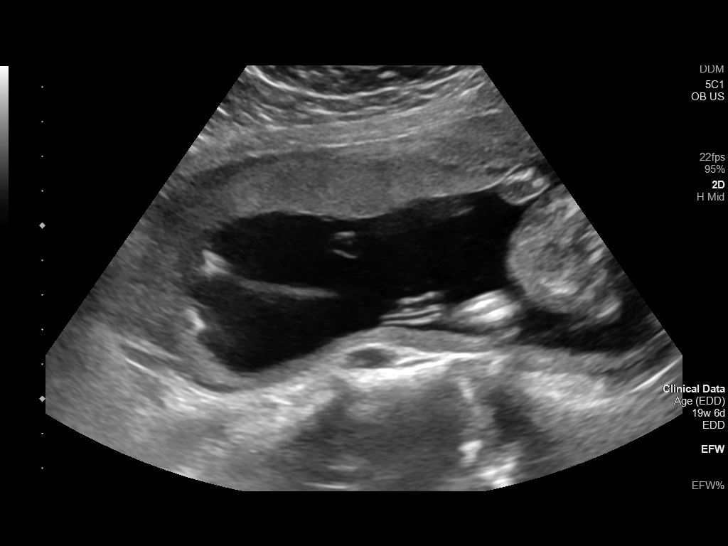
[im 99/103]
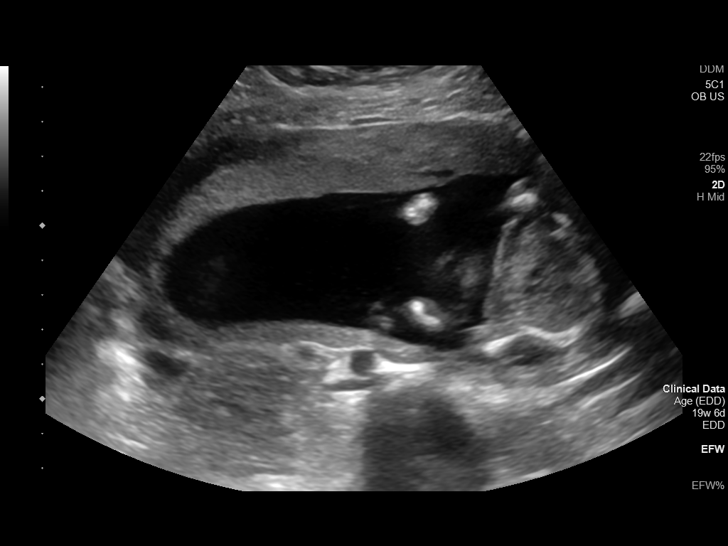

[13 of 28 positions shown; findings below may reference images not displayed]

FINDINGS: Number of Fetuses: 1

Heart Rate:  137 bpm

Movement: Yes

Presentation: Cephalic

Previa: No

Placental Location: Fundal and anterior

Amniotic Fluid (Subjective): Within normal limits

Amniotic Fluid (Objective):

Vertical pocket = 4.2cm

FETAL BIOMETRY

BPD: 4.5cm 19w 4d

HC:   16.3cm 19w 1d

AC:   14.5cm 19w 5d

FL:   2.9cm 19w 2d

Current Mean GA: 19w 4d US EDC: 03/22/2022

Assigned GA:  19w 6d Assigned EDC: 03/20/2022

FETAL ANATOMY

Lateral Ventricles: Appears normal

Thalami/CSP: Appears normal

Posterior Fossa:  Appears normal

Nuchal Region: Appears normal   NFT= 3.7 mm

Upper Lip: Appears normal

Spine: Appears normal

4 Chamber Heart on Left: Appears normal

LVOT: Appears normal

RVOT: Appears normal

Stomach on Left: Appears normal

3 Vessel Cord: Appears normal

Cord Insertion site: Appears normal

Kidneys: Appears normal

Bladder: Appears normal

Extremities: Appears normal

Sex: Male

Maternal Findings:

Cervix:  5.5 cm TA
IMPRESSION: Assigned GA currently 19 weeks 6 days.  Appropriate fetal growth.

Unremarkable fetal anatomic survey.  No fetal anomalies identified.

## 2022-02-26 LAB — OB RESULTS CONSOLE GC/CHLAMYDIA: Gonorrhea: NEGATIVE

## 2022-02-26 LAB — OB RESULTS CONSOLE GBS: GBS: NEGATIVE

## 2022-03-19 ENCOUNTER — Other Ambulatory Visit: Payer: Self-pay | Admitting: Obstetrics

## 2022-03-19 ENCOUNTER — Other Ambulatory Visit: Payer: Self-pay

## 2022-03-19 DIAGNOSIS — Z3490 Encounter for supervision of normal pregnancy, unspecified, unspecified trimester: Secondary | ICD-10-CM

## 2022-03-19 MED ORDER — LEVOTHYROXINE SODIUM 100 MCG PO TABS
ORAL_TABLET | ORAL | 0 refills | Status: DC
  Filled 2022-03-19: qty 30, 30d supply, fill #0

## 2022-03-19 NOTE — Progress Notes (Signed)
G2P1001 at [redacted]w[redacted]d LMP of 06/13/21, c/w early [place] 982w6d ?Scheduled for induction of labor for postdates on 03/26/22.  ? ?[facility] ?Pregnancy complicated by: ?Previous C- Section ?Hypothyroidism ? ?Prenatal Labs: ?Blood type/Rh O Pos  ?Antibody screen neg  ?Rubella Immune  ?Varicella Immune  ?RPR NR  ?HBsAg Neg  ?HIV NR  ?GC neg  ?Chlamydia neg  ?Genetic screening cfDNA negative  ?1 hour GTT 86  ?3 hour GTT N/a  ?GBS neg  ? ?Tdap: 12/25/21 ?Flu: 10/06/21 ?Contraception: POP ?Feeding preference: Breast ? ?____ ?FeAvelino LeedsCNM ?Certified Nurse Midwife ?[facility] ?[facility] ?

## 2022-03-26 ENCOUNTER — Other Ambulatory Visit: Payer: Self-pay

## 2022-03-26 ENCOUNTER — Inpatient Hospital Stay
Admission: EM | Admit: 2022-03-26 | Discharge: 2022-03-28 | DRG: 787 | Disposition: A | Discharge status: Home / Self Care | Payer: No Typology Code available for payment source

## 2022-03-26 ENCOUNTER — Inpatient Hospital Stay: Payer: No Typology Code available for payment source | Admitting: Certified Registered Nurse Anesthetist

## 2022-03-26 ENCOUNTER — Encounter: Admission: EM | Disposition: A | Discharge status: Home / Self Care | Payer: Self-pay | Source: Home / Self Care

## 2022-03-26 DIAGNOSIS — D62 Acute posthemorrhagic anemia: Secondary | ICD-10-CM | POA: Diagnosis not present

## 2022-03-26 DIAGNOSIS — O34211 Maternal care for low transverse scar from previous cesarean delivery: Secondary | ICD-10-CM | POA: Diagnosis present

## 2022-03-26 DIAGNOSIS — O9081 Anemia of the puerperium: Secondary | ICD-10-CM | POA: Diagnosis not present

## 2022-03-26 DIAGNOSIS — O99284 Endocrine, nutritional and metabolic diseases complicating childbirth: Secondary | ICD-10-CM | POA: Diagnosis present

## 2022-03-26 DIAGNOSIS — O480 Post-term pregnancy: Principal | ICD-10-CM | POA: Diagnosis present

## 2022-03-26 DIAGNOSIS — E039 Hypothyroidism, unspecified: Secondary | ICD-10-CM | POA: Diagnosis present

## 2022-03-26 DIAGNOSIS — Z3490 Encounter for supervision of normal pregnancy, unspecified, unspecified trimester: Secondary | ICD-10-CM | POA: Diagnosis present

## 2022-03-26 DIAGNOSIS — Z3A40 40 weeks gestation of pregnancy: Secondary | ICD-10-CM

## 2022-03-26 DIAGNOSIS — Z87891 Personal history of nicotine dependence: Secondary | ICD-10-CM

## 2022-03-26 LAB — CBC
HCT: 35.6 % — ABNORMAL LOW (ref 36.0–46.0)
Hemoglobin: 11.7 g/dL — ABNORMAL LOW (ref 12.0–15.0)
MCH: 31 pg (ref 26.0–34.0)
MCHC: 32.9 g/dL (ref 30.0–36.0)
MCV: 94.4 fL (ref 80.0–100.0)
Platelets: 202 10*3/uL (ref 150–400)
RBC: 3.77 MIL/uL — ABNORMAL LOW (ref 3.87–5.11)
RDW: 13.5 % (ref 11.5–15.5)
WBC: 11.4 10*3/uL — ABNORMAL HIGH (ref 4.0–10.5)
nRBC: 0 % (ref 0.0–0.2)

## 2022-03-26 LAB — RPR: RPR Ser Ql: NONREACTIVE

## 2022-03-26 SURGERY — Surgical Case
Anesthesia: Spinal

## 2022-03-26 MED ORDER — OXYTOCIN-SODIUM CHLORIDE 30-0.9 UT/500ML-% IV SOLN
2.5000 [IU]/h | INTRAVENOUS | Status: DC
  Administered 2022-03-26: 30 [IU] via INTRAVENOUS

## 2022-03-26 MED ORDER — IBUPROFEN 600 MG PO TABS
600.0000 mg | ORAL_TABLET | Freq: Four times a day (QID) | ORAL | Status: DC
  Administered 2022-03-28 (×2): 600 mg via ORAL
  Filled 2022-03-26 (×2): qty 1

## 2022-03-26 MED ORDER — SENNOSIDES-DOCUSATE SODIUM 8.6-50 MG PO TABS
2.0000 | ORAL_TABLET | ORAL | Status: DC
  Administered 2022-03-26 – 2022-03-27 (×2): 2 via ORAL
  Filled 2022-03-26 (×2): qty 2

## 2022-03-26 MED ORDER — MISOPROSTOL 25 MCG QUARTER TABLET: 25.0000 ug | ORAL_TABLET | ORAL | Status: DC | PRN

## 2022-03-26 MED ORDER — NALOXONE HCL 4 MG/10ML IJ SOLN
1.0000 ug/kg/h | INTRAVENOUS | Status: DC | PRN
  Filled 2022-03-26: qty 5

## 2022-03-26 MED ORDER — BISACODYL 10 MG RE SUPP
10.0000 mg | Freq: Every day | RECTAL | Status: DC | PRN
Start: 2022-03-26 — End: 2022-03-28

## 2022-03-26 MED ORDER — TETANUS-DIPHTH-ACELL PERTUSSIS 5-2.5-18.5 LF-MCG/0.5 IM SUSY
0.5000 mL | PREFILLED_SYRINGE | Freq: Once | INTRAMUSCULAR | Status: DC
  Filled 2022-03-26: qty 0.5

## 2022-03-26 MED ORDER — KETOROLAC TROMETHAMINE 30 MG/ML IJ SOLN: 30.0000 mg | Freq: Four times a day (QID) | INTRAMUSCULAR | Status: AC | PRN

## 2022-03-26 MED ORDER — KETOROLAC TROMETHAMINE 30 MG/ML IJ SOLN
30.0000 mg | Freq: Four times a day (QID) | INTRAMUSCULAR | Status: AC
  Administered 2022-03-26 – 2022-03-27 (×4): 30 mg via INTRAVENOUS
  Filled 2022-03-26 (×4): qty 1

## 2022-03-26 MED ORDER — OXYTOCIN-SODIUM CHLORIDE 30-0.9 UT/500ML-% IV SOLN
2.5000 [IU]/h | INTRAVENOUS | Status: AC
  Administered 2022-03-26: 2.5 [IU]/h via INTRAVENOUS

## 2022-03-26 MED ORDER — OXYCODONE HCL 5 MG PO TABS: 5.0000 mg | ORAL_TABLET | ORAL | Status: DC | PRN

## 2022-03-26 MED ORDER — DEXAMETHASONE SODIUM PHOSPHATE 10 MG/ML IJ SOLN
INTRAMUSCULAR | Status: DC | PRN
  Administered 2022-03-26: 5 mg via INTRAVENOUS

## 2022-03-26 MED ORDER — CEFAZOLIN SODIUM-DEXTROSE 2-4 GM/100ML-% IV SOLN
2.0000 g | INTRAVENOUS | Status: AC
  Administered 2022-03-26: 2 g via INTRAVENOUS
  Filled 2022-03-26: qty 100

## 2022-03-26 MED ORDER — SOD CITRATE-CITRIC ACID 500-334 MG/5ML PO SOLN: 30.0000 mL | ORAL | Status: DC

## 2022-03-26 MED ORDER — ONDANSETRON HCL 4 MG/2ML IJ SOLN: 4.0000 mg | Freq: Four times a day (QID) | INTRAMUSCULAR | Status: DC | PRN

## 2022-03-26 MED ORDER — SOD CITRATE-CITRIC ACID 500-334 MG/5ML PO SOLN
ORAL | Status: AC
  Filled 2022-03-26: qty 15

## 2022-03-26 MED ORDER — LACTATED RINGERS IV SOLN: 500.0000 mL | INTRAVENOUS | Status: DC | PRN

## 2022-03-26 MED ORDER — MENTHOL 3 MG MT LOZG: 1.0000 | LOZENGE | OROMUCOSAL | Status: DC | PRN

## 2022-03-26 MED ORDER — FENTANYL CITRATE (PF) 100 MCG/2ML IJ SOLN
INTRAMUSCULAR | Status: AC
  Filled 2022-03-26: qty 2

## 2022-03-26 MED ORDER — LEVOTHYROXINE SODIUM 100 MCG PO TABS
100.0000 ug | ORAL_TABLET | Freq: Every day | ORAL | Status: DC
  Administered 2022-03-27 – 2022-03-28 (×2): 100 ug via ORAL
  Filled 2022-03-26 (×2): qty 1

## 2022-03-26 MED ORDER — FLEET ENEMA 7-19 GM/118ML RE ENEM: 1.0000 | ENEMA | Freq: Every day | RECTAL | Status: DC | PRN

## 2022-03-26 MED ORDER — MEASLES, MUMPS & RUBELLA VAC IJ SOLR
0.5000 mL | Freq: Once | INTRAMUSCULAR | Status: DC
  Filled 2022-03-26: qty 0.5

## 2022-03-26 MED ORDER — BUTORPHANOL TARTRATE 1 MG/ML IJ SOLN: 1.0000 mg | INTRAMUSCULAR | Status: DC | PRN

## 2022-03-26 MED ORDER — PHENYLEPHRINE HCL-NACL 20-0.9 MG/250ML-% IV SOLN
INTRAVENOUS | Status: DC | PRN
  Administered 2022-03-26: 20 ug/min via INTRAVENOUS

## 2022-03-26 MED ORDER — BUPIVACAINE HCL (PF) 0.5 % IJ SOLN
30.0000 mL | Freq: Once | INTRAMUSCULAR | Status: DC
  Filled 2022-03-26: qty 30

## 2022-03-26 MED ORDER — FENTANYL CITRATE (PF) 100 MCG/2ML IJ SOLN
INTRAMUSCULAR | Status: DC | PRN
  Administered 2022-03-26: 15 ug via INTRATHECAL

## 2022-03-26 MED ORDER — EPHEDRINE SULFATE-NACL 50-0.9 MG/10ML-% IV SOSY
PREFILLED_SYRINGE | INTRAVENOUS | Status: DC | PRN
  Administered 2022-03-26 (×2): 5 mg via INTRAVENOUS

## 2022-03-26 MED ORDER — SOD CITRATE-CITRIC ACID 500-334 MG/5ML PO SOLN: 30.0000 mL | ORAL | Status: DC | PRN

## 2022-03-26 MED ORDER — ONDANSETRON HCL 4 MG/2ML IJ SOLN
INTRAMUSCULAR | Status: DC | PRN
  Administered 2022-03-26: 4 mg via INTRAVENOUS

## 2022-03-26 MED ORDER — MORPHINE SULFATE (PF) 0.5 MG/ML IJ SOLN
INTRAMUSCULAR | Status: AC
  Filled 2022-03-26: qty 10

## 2022-03-26 MED ORDER — BUPIVACAINE HCL (PF) 0.25 % IJ SOLN
INTRAMUSCULAR | Status: DC | PRN
  Administered 2022-03-26: 60 mL

## 2022-03-26 MED ORDER — PRENATAL MULTIVITAMIN CH
1.0000 | ORAL_TABLET | Freq: Every day | ORAL | Status: DC
  Administered 2022-03-27: 1 via ORAL
  Filled 2022-03-26: qty 1

## 2022-03-26 MED ORDER — AMMONIA AROMATIC IN INHA
RESPIRATORY_TRACT | Status: AC
  Filled 2022-03-26: qty 10

## 2022-03-26 MED ORDER — DIPHENHYDRAMINE HCL 25 MG PO CAPS: 25.0000 mg | ORAL_CAPSULE | ORAL | Status: DC | PRN

## 2022-03-26 MED ORDER — SIMETHICONE 80 MG PO CHEW
80.0000 mg | CHEWABLE_TABLET | Freq: Three times a day (TID) | ORAL | Status: DC
  Administered 2022-03-27 – 2022-03-28 (×3): 80 mg via ORAL
  Filled 2022-03-26 (×4): qty 1

## 2022-03-26 MED ORDER — MORPHINE SULFATE (PF) 0.5 MG/ML IJ SOLN
INTRAMUSCULAR | Status: DC | PRN
  Administered 2022-03-26: .1 mg via INTRATHECAL

## 2022-03-26 MED ORDER — SIMETHICONE 80 MG PO CHEW
80.0000 mg | CHEWABLE_TABLET | ORAL | Status: DC | PRN
  Administered 2022-03-27: 80 mg via ORAL

## 2022-03-26 MED ORDER — PHENYLEPHRINE HCL (PRESSORS) 10 MG/ML IV SOLN
INTRAVENOUS | Status: DC | PRN
  Administered 2022-03-26 (×3): 80 ug via INTRAVENOUS

## 2022-03-26 MED ORDER — OXYTOCIN-SODIUM CHLORIDE 30-0.9 UT/500ML-% IV SOLN
1.0000 m[IU]/min | INTRAVENOUS | Status: DC
  Administered 2022-03-26: 2 m[IU]/min via INTRAVENOUS
  Filled 2022-03-26: qty 500

## 2022-03-26 MED ORDER — COCONUT OIL OIL
1.0000 "application " | TOPICAL_OIL | Status: DC | PRN
  Filled 2022-03-26: qty 120

## 2022-03-26 MED ORDER — WITCH HAZEL-GLYCERIN EX PADS: 1.0000 "application " | MEDICATED_PAD | CUTANEOUS | Status: DC | PRN

## 2022-03-26 MED ORDER — MISOPROSTOL 200 MCG PO TABS
ORAL_TABLET | ORAL | Status: AC
  Filled 2022-03-26: qty 4

## 2022-03-26 MED ORDER — DIPHENHYDRAMINE HCL 50 MG/ML IJ SOLN
12.5000 mg | INTRAMUSCULAR | Status: DC | PRN
  Administered 2022-03-26: 12.5 mg via INTRAVENOUS
  Filled 2022-03-26: qty 1

## 2022-03-26 MED ORDER — 0.9 % SODIUM CHLORIDE (POUR BTL) OPTIME
TOPICAL | Status: DC | PRN
  Administered 2022-03-26: 50 mL

## 2022-03-26 MED ORDER — ACETAMINOPHEN 325 MG PO TABS: 650.0000 mg | ORAL_TABLET | ORAL | Status: DC | PRN

## 2022-03-26 MED ORDER — ONDANSETRON HCL 4 MG/2ML IJ SOLN: 4.0000 mg | Freq: Three times a day (TID) | INTRAMUSCULAR | Status: DC | PRN

## 2022-03-26 MED ORDER — ACETAMINOPHEN 500 MG PO TABS: 1000.0000 mg | ORAL_TABLET | Freq: Four times a day (QID) | ORAL | Status: DC

## 2022-03-26 MED ORDER — OXYTOCIN-SODIUM CHLORIDE 30-0.9 UT/500ML-% IV SOLN
INTRAVENOUS | Status: AC
Start: 2022-03-26 — End: 2022-03-27
  Filled 2022-03-26: qty 500

## 2022-03-26 MED ORDER — LIDOCAINE HCL (PF) 1 % IJ SOLN
INTRAMUSCULAR | Status: AC
  Filled 2022-03-26: qty 30

## 2022-03-26 MED ORDER — ACETAMINOPHEN 500 MG PO TABS
1000.0000 mg | ORAL_TABLET | Freq: Four times a day (QID) | ORAL | Status: AC
  Administered 2022-03-26 – 2022-03-27 (×4): 1000 mg via ORAL
  Filled 2022-03-26 (×4): qty 2

## 2022-03-26 MED ORDER — OXYTOCIN 10 UNIT/ML IJ SOLN
INTRAMUSCULAR | Status: AC
  Filled 2022-03-26: qty 2

## 2022-03-26 MED ORDER — SODIUM CHLORIDE (PF) 0.9 % IJ SOLN
INTRAMUSCULAR | Status: AC
  Filled 2022-03-26: qty 50

## 2022-03-26 MED ORDER — PROPOFOL 10 MG/ML IV BOLUS
INTRAVENOUS | Status: AC
  Filled 2022-03-26: qty 20

## 2022-03-26 MED ORDER — FERROUS SULFATE 325 (65 FE) MG PO TABS
325.0000 mg | ORAL_TABLET | Freq: Two times a day (BID) | ORAL | Status: DC
  Administered 2022-03-27 – 2022-03-28 (×3): 325 mg via ORAL
  Filled 2022-03-26 (×3): qty 1

## 2022-03-26 MED ORDER — SODIUM CHLORIDE 0.9% FLUSH: 3.0000 mL | INTRAVENOUS | Status: DC | PRN

## 2022-03-26 MED ORDER — NALOXONE HCL 0.4 MG/ML IJ SOLN: 0.4000 mg | INTRAMUSCULAR | Status: DC | PRN

## 2022-03-26 MED ORDER — BUPIVACAINE IN DEXTROSE 0.75-8.25 % IT SOLN
INTRATHECAL | Status: DC | PRN
  Administered 2022-03-26: 1.5 mL via INTRATHECAL

## 2022-03-26 MED ORDER — DIPHENHYDRAMINE HCL 25 MG PO CAPS
25.0000 mg | ORAL_CAPSULE | Freq: Four times a day (QID) | ORAL | Status: DC | PRN
Start: 2022-03-26 — End: 2022-03-28

## 2022-03-26 MED ORDER — SCOPOLAMINE 1 MG/3DAYS TD PT72
1.0000 | MEDICATED_PATCH | Freq: Once | TRANSDERMAL | Status: DC
  Administered 2022-03-26: 1.5 mg via TRANSDERMAL
  Filled 2022-03-26: qty 1

## 2022-03-26 MED ORDER — LACTATED RINGERS IV SOLN: INTRAVENOUS | Status: DC

## 2022-03-26 MED ORDER — DIBUCAINE (PERIANAL) 1 % EX OINT: 1.0000 "application " | TOPICAL_OINTMENT | CUTANEOUS | Status: DC | PRN

## 2022-03-26 MED ORDER — SODIUM CHLORIDE 0.9% FLUSH
50.0000 mL | Freq: Once | INTRAVENOUS | Status: DC
  Filled 2022-03-26: qty 51

## 2022-03-26 MED ORDER — GABAPENTIN 300 MG PO CAPS
300.0000 mg | ORAL_CAPSULE | Freq: Every day | ORAL | Status: DC
  Administered 2022-03-26 – 2022-03-27 (×2): 300 mg via ORAL
  Filled 2022-03-26 (×2): qty 1

## 2022-03-26 MED ORDER — OXYTOCIN BOLUS FROM INFUSION
333.0000 mL | Freq: Once | INTRAVENOUS | Status: DC
Start: 2022-03-26 — End: 2022-03-26

## 2022-03-26 MED ORDER — LIDOCAINE HCL (PF) 1 % IJ SOLN: 30.0000 mL | INTRAMUSCULAR | Status: DC | PRN

## 2022-03-26 MED ORDER — TERBUTALINE SULFATE 1 MG/ML IJ SOLN: 0.2500 mg | Freq: Once | INTRAMUSCULAR | Status: DC | PRN

## 2022-03-26 MED ORDER — GLYCOPYRROLATE 0.2 MG/ML IJ SOLN
INTRAMUSCULAR | Status: DC | PRN
  Administered 2022-03-26: .2 mg via INTRAVENOUS

## 2022-03-26 SURGICAL SUPPLY — 30 items
BACTOSHIELD CHG 4% 4OZ (MISCELLANEOUS) ×1
CHLORAPREP W/TINT 26 (MISCELLANEOUS) ×2 IMPLANT
DRESSING TELFA 8X3 (GAUZE/BANDAGES/DRESSINGS) ×1 IMPLANT
DRSG TELFA 3X8 NADH (GAUZE/BANDAGES/DRESSINGS) ×2 IMPLANT
ELECT REM PT RETURN 9FT ADLT (ELECTROSURGICAL) ×2
ELECTRODE REM PT RTRN 9FT ADLT (ELECTROSURGICAL) ×1 IMPLANT
GAUZE SPONGE 4X4 12PLY STRL (GAUZE/BANDAGES/DRESSINGS) ×2 IMPLANT
GOWN STRL REUS W/ TWL LRG LVL3 (GOWN DISPOSABLE) ×3 IMPLANT
GOWN STRL REUS W/TWL LRG LVL3 (GOWN DISPOSABLE) ×3
MANIFOLD NEPTUNE II (INSTRUMENTS) ×2 IMPLANT
MAT PREVALON FULL STRYKER (MISCELLANEOUS) ×2 IMPLANT
NDL HYPO 25GX1X1/2 BEV (NEEDLE) ×1 IMPLANT
NEEDLE HYPO 25GX1X1/2 BEV (NEEDLE) ×2 IMPLANT
NS IRRIG 1000ML POUR BTL (IV SOLUTION) ×2 IMPLANT
PACK C SECTION AR (MISCELLANEOUS) ×2 IMPLANT
PAD DRESSING TELFA 3X8 NADH (GAUZE/BANDAGES/DRESSINGS) ×1 IMPLANT
PAD OB MATERNITY 4.3X12.25 (PERSONAL CARE ITEMS) ×2 IMPLANT
PAD PREP 24X41 OB/GYN DISP (PERSONAL CARE ITEMS) ×2 IMPLANT
SCRUB CHG 4% DYNA-HEX 4OZ (MISCELLANEOUS) ×1 IMPLANT
SUT MNCRL 4-0 (SUTURE) ×1
SUT MNCRL 4-0 27XMFL (SUTURE) ×1
SUT VIC AB 0 CT1 36 (SUTURE) ×4 IMPLANT
SUT VIC AB 0 CTX 36 (SUTURE) ×2
SUT VIC AB 0 CTX36XBRD ANBCTRL (SUTURE) ×2 IMPLANT
SUT VIC AB 2-0 SH 27 (SUTURE) ×2
SUT VIC AB 2-0 SH 27XBRD (SUTURE) ×2 IMPLANT
SUTURE MNCRL 4-0 27XMF (SUTURE) ×1 IMPLANT
SYR 30ML LL (SYRINGE) ×4 IMPLANT
TAPE MEDIFIX FOAM 3 (GAUZE/BANDAGES/DRESSINGS) ×1 IMPLANT
WATER STERILE IRR 500ML POUR (IV SOLUTION) ×2 IMPLANT

## 2022-03-26 NOTE — Discharge Summary (Signed)
Obstetrical Discharge Summary ? ?Patient Name: Alyssa Moore ?DOB: Dec 31, 1990 ?MRN: [id number] ? ?Date of Admission: 03/26/2022 ?Date of Delivery: 03/26/22 ?Delivered by: Leafy Ro MD ?Date of Discharge: 03/28/2022 ? ?Primary OB: [place]  ?LMP:No LMP recorded. ?EDC Estimated Date of Delivery: 03/20/22 ?Gestational Age at Delivery: [redacted]w[redacted]d ? ?Antepartum complications:  ?Previous C- Section- breech ?Hypothyroidism, currently on Synthroid 1037m daily ? ?Admitting Diagnosis:  trial of labor after cesarean, 40.6wks ?Secondary Diagnosis: repeat LTCS, fetal intolerance.  ?Patient Active Problem List  ? Diagnosis Date Noted  ? Encounter for planned induction of labor 03/26/2022  ? Previous cesarean section 01/15/2022  ? Supervision of high risk pregnancy in third trimester 08/21/2021  ? Hypothyroidism (acquired) 06/09/2018  ? ? ?Augmentation: Pitocin ?Complications: None ?Intrapartum complications/course: IOL at 4090w6dh fetal intolerance remote from delivery; decision to proceed to repeat LTCS, see OP note. ?Date of Delivery: 03/26/22 ?Delivered By: BeaLeafy Ro ?Delivery Type: repeat cesarean section, low transverse incision ?Anesthesia: spinal ?Placenta: manual ?Laceration: none ?Episiotomy: none ?Newborn Data: ?Live born female  ?Birth Weight: 6 lb 11.6 oz (3050 g) ?APGAR: 8, 9 ? ?Newborn Delivery   ?Birth date/time: 03/26/2022 17:33:00 ?Delivery type: C-Section, Low Transverse ?Trial of labor: Yes ?C-section categorization: Repeat ?  ?  ? ?Postpartum Procedures: none ? ?Edinburgh:  ? ?  03/27/2022  ?  9:10 AM  ?Edinburgh Postnatal Depression Scale Screening Tool  ?I have been able to laugh and see the funny side of things. 0  ?I have looked forward with enjoyment to things. 0  ?I have blamed myself unnecessarily when things went wrong. 1  ?I have been anxious or worried for no good reason. 1  ?I have felt scared or panicky for no good reason. 1  ?Things have been getting on top of me. 1  ?I have been so unhappy that I  have had difficulty sleeping. 0  ?I have felt sad or miserable. 0  ?I have been so unhappy that I have been crying. 0  ?The thought of harming myself has occurred to me. 0  ?Edinburgh Postnatal Depression Scale Total 4  ?  ? ? ?Post partum course: Cesarean Section-   Patient had an uncomplicated postpartum course.  By time of discharge on POD#2, her pain was controlled on oral pain medications; she had appropriate lochia and was ambulating, voiding without difficulty, tolerating regular diet and passing flatus.   She was deemed stable for discharge to home.   ? ?Discharge Physical Exam:  ?BP 105/72 (BP Location: Right Arm)   Pulse 87   Temp 97.9 ?F (36.6 ?C) (Oral)   Resp 20   Ht '5\' 3"'$  (1.6 m)   Wt 83.5 kg   SpO2 100% Comment: Room Air  Breastfeeding Unknown   BMI 32.59 kg/m?  ? ?General: NAD ?CV: RRR ?Pulm: CTABL, nl effort ?ABD: s/nd/nt, fundus firm and below the umbilicus ?Lochia: moderate ?Incision: c/d/i, healing well, no significant drainage, no dehiscence, no significant erythema; [place] Dsg intact ?DVT Evaluation: LE non-ttp, no evidence of DVT on exam. ? ?Hemoglobin  ?Date Value Ref Range Status  ?03/27/2022 10.3 (L) 12.0 - 15.0 g/dL Final  ?04/25/2020 11.4 11.1 - 15.9 g/dL Final  ? ?HCT  ?Date Value Ref Range Status  ?03/27/2022 31.3 (L) 36.0 - 46.0 % Final  ? ?Hematocrit  ?Date Value Ref Range Status  ?04/25/2020 33.9 (L) 34.0 - 46.6 % Final  ? ? ? ?Disposition: stable, discharge to home. ?Baby Feeding: breastmilk ?Baby Disposition: home with mom ? ?  Rh Immune globulin given: n/a ?Rubella vaccine given: immune ?Varicella vaccine given: immune ?Tdap vaccine given in AP or PP setting: 12/25/21 ?Flu vaccine given in AP or PP setting: 09/2021 ? ?Contraception: POPs ? ?Prenatal Labs:  ?Blood type/Rh O Pos  ?Antibody screen neg  ?Rubella Immune  ?Varicella Immune  ?RPR NR  ?HBsAg Neg  ?HIV NR  ?GC neg  ?Chlamydia neg  ?Genetic screening cfDNA negative  ?1 hour GTT 86  ?3 hour GTT N/a  ?GBS neg   ? ? ? ?Plan:  ?Darrall Dears was discharged to home in good condition. ?Follow-up appointment with delivering provider in 2 weeks. ? ?Discharge Medications: ?Allergies as of 03/28/2022   ?No Known Allergies ?  ? ?  ?Medication List  ?  ? ?TAKE these medications   ? ?coconut oil Oil ?Apply 1 application. topically as needed. ?  ?diphenhydrAMINE 25 mg capsule ?Commonly known as: BENADRYL ?Take 1 capsule (25 mg total) by mouth every 6 (six) hours as needed for itching. ?  ?ferrous sulfate 325 (65 FE) MG tablet ?Take 1 tablet (325 mg total) by mouth 2 (two) times daily with a meal. ?  ?gabapentin 300 MG capsule ?Commonly known as: NEURONTIN ?Take 1 capsule (300 mg total) by mouth at bedtime for 3 days. ?  ?ibuprofen 600 MG tablet ?Commonly known as: ADVIL ?Take 1 tablet (600 mg total) by mouth every 6 (six) hours. ?  ?levothyroxine 100 MCG tablet ?Commonly known as: SYNTHROID ?Take 1 tablet (100 mcg total) by mouth once daily Take on an empty stomach with a glass of water at least 30-60 minutes before breakfast. ?What changed: Another medication with the same name was removed. Continue taking this medication, and follow the directions you see here. ?  ?oxyCODONE 5 MG immediate release tablet ?Commonly known as: Oxy IR/ROXICODONE ?Take 1 tablet (5 mg total) by mouth every 6 (six) hours as needed for up to 7 days for moderate pain. ?  ?PRENATAL PO ?Take by mouth daily. ?  ?senna-docusate 8.6-50 MG tablet ?Commonly known as: Senokot-S ?Take 2 tablets by mouth daily. ?  ?simethicone 80 MG chewable tablet ?Commonly known as: MYLICON ?Chew 1 tablet (80 mg total) by mouth 3 (three) times daily after meals. ?  ? ?  ? ? ? Follow-up Information   ? ? Benjaman Kindler, MD Follow up in 2 week(s).   ?Specialty: Obstetrics and Gynecology ?Why: For postop check ?Contact information: ?[address] ?[place] [address] ?[phone] ? ? ?  ?  ? ?  ?  ? ?  ? ? ?Signed: ? ?Francetta Found, CNM ?03/28/2022  ?8:41 AM ? ? ?

## 2022-03-26 NOTE — Anesthesia Procedure Notes (Signed)
Spinal ? ?Patient location during procedure: OR ?Start time: 03/26/2022 5:09 PM ?End time: 03/26/2022 5:14 PM ?Reason for block: surgical anesthesia ?Staffing ?Performed: resident/CRNA  ?Anesthesiologist: Darrin Nipper, MD ?Resident/CRNA: Nelda Marseille, CRNA ?Preanesthetic Checklist ?Completed: patient identified, IV checked, site marked, risks and benefits discussed, surgical consent, monitors and equipment checked, pre-op evaluation and timeout performed ?Spinal Block ?Patient position: sitting ?Prep: DuraPrep ?Patient monitoring: heart rate, cardiac monitor, continuous pulse ox and blood pressure ?Approach: midline ?Location: L3-4 ?Injection technique: single-shot ?Needle ?Needle type: Sprotte  ?Needle gauge: 24 G ?Needle length: 9 cm ?Assessment ?Sensory level: T4 ?Events: CSF return ? ? ? ?

## 2022-03-26 NOTE — Transfer of Care (Signed)
Immediate Anesthesia Transfer of Care Note ? ?Patient: Alyssa Moore ? ?Procedure(s) Performed: CESAREAN SECTION ? ?Patient Location: Mother/Baby ? ?Anesthesia Type:Spinal ? ?Level of Consciousness: awake, alert , oriented and patient cooperative ? ?Airway & Oxygen Therapy: Patient Spontanous Breathing ? ?Post-op Assessment: Report given to RN and Post -op Vital signs reviewed and stable ? ?Post vital signs: Reviewed and stable ? ?Last Vitals:  ?Vitals Value Taken Time  ?BP 106/55 03/26/22 1829  ?Temp 36.5 ?C 03/26/22 1829  ?Pulse 61 03/26/22 1829  ?Resp 13 03/26/22 1829  ?SpO2 98 % 03/26/22 1829  ? ? ?Last Pain:  ?Vitals:  ? 03/26/22 1829  ?TempSrc: Oral  ?PainSc:   ?   ? ?  ? ?Complications: No notable events documented. ?

## 2022-03-26 NOTE — Anesthesia Preprocedure Evaluation (Signed)
Anesthesia Evaluation  ?Patient identified by MRN, date of birth, ID band ?Patient awake ? ? ? ?Reviewed: ?Allergy & Precautions, NPO status , Patient's Chart, lab work & pertinent test results ? ?History of Anesthesia Complications ?Negative for: history of anesthetic complications ? ?Airway ?Mallampati: II ? ? ?Neck ROM: Full ? ? ? Dental ?no notable dental hx. ? ?  ?Pulmonary ?neg pulmonary ROS,  ?  ?Pulmonary exam normal ?breath sounds clear to auscultation ? ? ? ? ? ? Cardiovascular ?Exercise Tolerance: Good ?negative cardio ROS ?Normal cardiovascular exam ?Rhythm:Regular Rate:Normal ? ? ?  ?Neuro/Psych ?negative neurological ROS ?   ? GI/Hepatic ?GERD (with pregnancy)  Controlled,  ?Endo/Other  ?Hypothyroidism  ? Renal/GU ?negative Renal ROS  ? ?  ?Musculoskeletal ? ? Abdominal ?  ?Peds ? Hematology ?negative hematology ROS ?(+)   ?Anesthesia Other Findings ? ? Reproductive/Obstetrics ? ?  ? ? ? ? ? ? ? ? ? ? ? ? ? ?  ?  ? ? ? ? ? ? ? ? ?Anesthesia Physical ?Anesthesia Plan ? ?ASA: 2 ? ?Anesthesia Plan: Spinal  ? ?Post-op Pain Management:   ? ?Induction: Intravenous ? ?PONV Risk Score and Plan: 2 and Treatment may vary due to age or medical condition and Ondansetron ? ?Airway Management Planned: Natural Airway and Nasal Cannula ? ?Additional Equipment:  ? ?Intra-op Plan:  ? ?Post-operative Plan:  ? ?Informed Consent: I have reviewed the patients History and Physical, chart, labs and discussed the procedure including the risks, benefits and alternatives for the proposed anesthesia with the patient or authorized representative who has indicated his/her understanding and acceptance.  ? ? ? ?Dental advisory given ? ?Plan Discussed with: CRNA ? ?Anesthesia Plan Comments: (31 yo G2P1011 at 39 6/7 presenting for repeat c-section.  Patient not on anticoagulation.  Appropriately NPO.  Plan for spinal with GETA backup.  Patient consented for risks of anesthesia including but not limited  to:  ?- adverse reactions to medications ?- damage to eyes, teeth, lips or other oral mucosa ?- nerve damage due to positioning  ?- sore throat or hoarseness ?- headache, bleeding, infection, nerve damage 2/2 spinal ?- damage to heart, brain, nerves, lungs, other parts of body or loss of life ? ?Informed patient about role of CRNA in peri- and intra-operative care.  Patient voiced understanding.)  ? ? ? ? ? ? ?Anesthesia Quick Evaluation ? ?

## 2022-03-26 NOTE — H&P (Signed)
OB History & Physical  ? ?History of Present Illness:  ? ?Chief Complaint: scheduled IOL for postdates  ? ?HPI:  ?Alyssa Moore is a 31 y.o. G2P1001 female at 41w6ddated by LMP of 06/13/2021, c/w early [place] 960w6d She presents to L&D for scheduled IOL d/t postdates pregnancy. ? ?Reports active fetal movement  ?Contractions: denies  ?LOF/SROM: denies  ?Vaginal bleeding: denies  ? ?Factors complicating pregnancy:  ?Previous C- Section ?Hypothyroidism ? ?Patient Active Problem List  ? Diagnosis Date Noted  ? Encounter for planned induction of labor 03/26/2022  ? Previous cesarean section 01/15/2022  ? Supervision of high risk pregnancy in third trimester 08/21/2021  ? Hypothyroidism (acquired) 06/09/2018  ? ? ? ?Maternal Medical History:  ? ?Past Medical History:  ?Diagnosis Date  ? Hypothyroidism   ? ? ?Past Surgical History:  ?Procedure Laterality Date  ? CESAREAN SECTION N/A 11/03/2018  ? Procedure: CESAREAN SECTION;  Surgeon: Ward, ChHonor LohMD;  Location: [facility] ORS;  Service: Obstetrics;  Laterality: N/A;  ? DILATION AND CURETTAGE OF UTERUS N/A 05/07/2021  ? Procedure: SUCTION D&C;  Surgeon: BeBenjaman KindlerMD;  Location: [facility] ORS;  Service: Gynecology;  Laterality: N/A;  ? EYE SURGERY    ? FRACTURE SURGERY    ? WISDOM TOOTH EXTRACTION    ? ? ?No Known Allergies ? ?Prior to Admission medications   ?Medication Sig Start Date End Date Taking? Authorizing Provider  ?levothyroxine (SYNTHROID) 100 MCG tablet Take 1 tablet (100 mcg total) by mouth once daily Take on an empty stomach with a glass of water at least 30-60 minutes before breakfast. 03/19/22  Yes   ?Prenatal Vit-Fe Fumarate-FA (PRENATAL PO) Take by mouth daily.   Yes [provider]  ?levothyroxine (SYNTHROID) 125 MCG tablet Take 1 tablet (125 mcg total) by mouth once daily Take on an empty stomach with a glass of water at least 30-60 minutes before breakfast. 11/01/21     ?levothyroxine (SYNTHROID) 125 MCG tablet Take 1 tablet (125 mcg total) by  mouth once daily Take on an empty stomach with a glass of water at least 30-60 minutes before breakfast. 11/01/21     ?levothyroxine (SYNTHROID) 137 MCG tablet Take 1 tablet (137 mcg total) by mouth once daily 09/27/21     ?levothyroxine (SYNTHROID) 137 MCG tablet Take by mouth. 09/27/21   [provider]  ?levothyroxine (SYNTHROID) 150 MCG tablet Take 1 tablet (150 mcg total) by mouth daily before breakfast. 08/23/21   GiJerrol Banana MD  ?ferrous sulfate 325 (65 FE) MG tablet Take 1 tablet (325 mg total) by mouth 2 (two) times daily with a meal. ?Patient not taking: Reported on 09/22/2019 11/05/18 04/21/20  McVey, ReMurray HodgkinsCNM  ? ? ? ?[facility] ? ?Social History: She  reports that she has quit smoking. She has never used smokeless tobacco. She reports that she does not currently use alcohol. She reports that she does not use drugs. ? ?Family History: family history includes Healthy in her mother; Heart failure in her father; Lymphoma in her father; Melanoma in her father.  ? ?Review of Systems: A full review of systems was performed and negative except as noted in the HPI.   ? ? ?Physical Exam:  ?Vital Signs: BP 121/63 (BP Location: Left Arm)   Pulse 72   Temp (!) 97.5 ?F (36.4 ?C) (Oral)   Resp 16   Ht '5\' 3"'$  (1.6 m)   Wt 83.5 kg  SpO2 100%   BMI 32.59 kg/m?  ?Physical Exam ? ?General: no acute distress.  ?HEENT: normocephalic, atraumatic ?Heart: regular rate & rhythm.  No murmurs/rubs/gallops ?Lungs: clear to auscultation bilaterally, normal respiratory effort ?Abdomen: soft, gravid, non-tender;  EFW: 7lbs  ?Pelvic:  ? External: Normal external female genitalia ? Cervix: Dilation: 1 / Effacement (%): Thick /    ?  ?Extremities: non-tender, symmetric, No edema bilaterally.  DTRs: 2+/2+  ?Neurologic: Alert & oriented x 3.   ? ?Results for orders placed or performed during the hospital encounter of 03/26/22 (from the past 24 hour(s))  ?CBC     Status: Abnormal   ? Collection Time: 03/26/22  5:39 AM  ?Result Value Ref Range  ? WBC 11.4 (H) 4.0 - 10.5 K/uL  ? RBC 3.77 (L) 3.87 - 5.11 MIL/uL  ? Hemoglobin 11.7 (L) 12.0 - 15.0 g/dL  ? HCT 35.6 (L) 36.0 - 46.0 %  ? MCV 94.4 80.0 - 100.0 fL  ? MCH 31.0 26.0 - 34.0 pg  ? MCHC 32.9 30.0 - 36.0 g/dL  ? RDW 13.5 11.5 - 15.5 %  ? Platelets 202 150 - 400 K/uL  ? nRBC 0.0 0.0 - 0.2 %  ? ? ?Pertinent Results:  ?Prenatal Labs: ?Blood type/Rh O Pos  ?Antibody screen neg  ?Rubella Immune  ?Varicella Immune  ?RPR NR  ?HBsAg Neg  ?HIV NR  ?GC neg  ?Chlamydia neg  ?Genetic screening cfDNA negative  ?1 hour GTT 86  ?3 hour GTT N/a  ?GBS neg  ? ?FHT:  FHR: 135 bpm, variability: moderate,  accelerations:  Present,  decelerations:  Present occasional variables  ?Category/reactivity:  Category I ?UC:   every 2-3 minutes after initiation of oxytocin ?  ?Cephalic by Leopolds and SVE  ? ?No results found. ? ?Assessment:  ?Alyssa Moore is a 31 y.o. G72P1001 female at 50w6dwith postdates pregnancy.  ? ?Plan:  ?1. Admit to Labor & Delivery ?- consents reviewed and obtained ?- Dr. BLeafy Ronotified of admission and initiation of oxytocin - readily available  ? ?2. Fetal Well being  ?- Fetal Tracing: cat II for occasional variables  ?- Overall reassuring with moderate variability and accels  ?- Group B Streptococcus ppx not indicated: GBS neg ?- Presentation: cephalic confirmed by SVE  ? ?3. Routine OB: ?- Prenatal labs reviewed, as above ?- Rh pos ?- CBC, T&S, RPR on admit ?- Clear fluids, IVF ? ?4. Induction of labor  ?- Contractions monitored with external toco ?- Pelvis adequate for trial of labor  ?- Plan for induction with oxytocin  ?- Attempted placement of cervical balloon by FAvelino LeedsCNM but unable to pass through cervix d/t station of fetal head   ?- Plan for  continuous fetal monitoring ?- Maternal pain control as desired; planning regional anesthesia ?- Anticipate vaginal delivery ? ?5. Post Partum Planning: ?- Infant feeding:  breast  ?- Contraception: POP's ?- Tdap vaccine: given 12/25/2021 ?- Flu vaccine: 10/06/2021 ? ?[name] CNM ?03/26/22 ?8:33 AM ? ?ADrinda Butts CNM ?Certified Nurse Midwife ?[facility] ? ?  ?

## 2022-03-26 NOTE — Progress Notes (Signed)
Labor Progress Note ? ?Alyssa Moore is a 31 y.o. G2P1001 at 40w6dby LMP admitted for induction of labor due to postdates pregnancy. ? ?Subjective: feeling mild cramping  ? ?Objective: ?BP 121/63 (BP Location: Left Arm)   Pulse 72   Temp (!) 97.5 ?F (36.4 ?C) (Oral)   Resp 16   Ht '5\' 3"'$  (1.6 m)   Wt 83.5 kg   SpO2 100%   BMI 32.59 kg/m?  ?Notable VS details: reviewed  ? ?Fetal Assessment: ?FHT:  FHR: 140 bpm, variability: moderate,  accelerations:  Present,  decelerations:  Absent ?Category/reactivity:  Category I ?UC:   regular, every 4-5 minutes ?SVE:   deferred  ?Membrane status: Intact ?Amniotic color: N/A ? ?Labs: ?Lab Results  ?Component Value Date  ? WBC 11.4 (H) 03/26/2022  ? HGB 11.7 (L) 03/26/2022  ? HCT 35.6 (L) 03/26/2022  ? MCV 94.4 03/26/2022  ? PLT 202 03/26/2022  ? ? ?Assessment / Plan: ?Induction of labor d/t postdates pregnancy ?-Dr. BLeafy Roat bedside to review tracing and discuss plan of care options.  Alyssa Moore is leaning towards a repeat LTCS but would like to try restarting oxytocin first.  Tracing remains Cat I and overall reassuring.  Will try 1x1 oxytocin and close surveillance for fetal tolerance.  Dr. BLeafy Roremains readily available.   ? ?Labor:  Cervical ripening with oxytocin  ?Fetal Wellbeing:  Category I  ?Pain Control:  Labor support without medications ?I/D:  n/a ?Anticipated MOD:  Continue with plan for TOLAC, ok to proceed with repeat LTCS for the usual clinical indications  ? ?[name] CNM ?03/26/2022, 1:21 PM ? ? ? ? ? ? ? ? ?

## 2022-03-26 NOTE — Op Note (Signed)
?  Cesarean Section Procedure Note ? ?Date of procedure: 03/26/2022  ? ?Pre-operative Diagnosis: Intrauterine pregnancy at [redacted]w[redacted]d  ?- failed induction  ?- incomplete TOLAC for fetal intolerance of contractions ? ?Post-operative Diagnosis: same, delivered. ? ?Procedure: Repeat Low Transverse Cesarean Section through Pfannenstiel incision ? ?Surgeon: BBenjaman Kindler MD ? ?Assistant(s):  RHassan Buckler CNM  ? ?Anesthesia: Spinal anesthesia ? ?Anesthesiologist: MDarrin Nipper MD Anesthesiologist: MDarrin Nipper MD ?CRNA: NNelda Marseille CRNA; SLowry Bowl CRNA ? ?Estimated Blood Loss:   6168m?        ?Drains: foley ?        ?Total IV Fluids: 100071m ?Urine Output: see anesthesia's report ?        ?Specimens: cord blood ?        ?Complications:  None; patient tolerated the procedure well. ?        ?Disposition: PACU - hemodynamically stable. ?        ?Condition: stable ? ?Findings: ? A female infant "JamDonzetta Sprungn cephalic presentation. ?Amniotic fluid - Meconium  ?Birth weight 3050 g.  ?Apgars of 8 and 9 at one and five minutes respectively.  ?Intact placenta with a three-vessel cord.  Grossly normal uterus, tubes and ovaries bilaterally. Moderate intraabdominal adhesions were noted. ? ?Indications: non-reassuring fetal status ? ?Procedure Details  ?The patient was taken to Operating Room, identified as the correct patient and the procedure verified as C-Section Delivery. A formal Time Out was held with all team members present and in agreement. ? ?After induction of anesthesia, the patient was draped and prepped in the usual sterile manner. A Pfannenstiel skin incision was made and carried down through the subcutaneous tissue to the fascia. Fascial incision was made and extended transversely with the Mayo scissors. The fascia was separated from the underlying rectus tissue superiorly and inferiorly. The peritoneum was identified and entered bluntly. Peritoneal incision was extended longitudinally. The utero-vesical  peritoneal reflection was incised transversely and a bladder flap was created digitally.  ? ?A low transverse hysterotomy was made. The fetus was delivered atraumatically. The umbilical cord was clamped x2 and cut and the infant was handed to the awaiting pediatricians. The placenta was removed intact and appeared normal, intact, and with a 3-vessel cord.  ? ?The uterus was exteriorized and cleared of all clot and debris. The hysterotomy was closed with running sutures of 0-Vicryl. A second imbricating layer was placed with the same suture. Excellent hemostasis was observed. The peritoneal cavity was cleared of all clots and debris. The uterus was returned to the abdomen.  ? ?The pelvis was irrigated and again, excellent hemostasis was noted. The fascia was then reapproximated with running sutures of 0 Vicryl.  The subcutaneous tissue was reapproximated with running sutures of 0 Vicry. The skin was reapproximated with Ensorb.  0.5% bupivicaine and 42m75m NSS placed in the fascial and skin lines. ? ?Instrument, sponge, and needle counts were correct prior to the abdominal closure and at the conclusion of the case.  ? ?The patient tolerated the procedure well and was transferred to the recovery room in stable condition.  ? ?BethBenjaman Kindler ?03/26/2022  ?

## 2022-03-26 NOTE — Progress Notes (Addendum)
Labor Progress Note ? ?Alyssa Moore is a 31 y.o. G2P1001 at 94w6dby LMP admitted for induction of labor due to postdates pregnancy. ? ?Subjective: denies cramping or contractions  ? ?Objective: ?BP 121/63 (BP Location: Left Arm)   Pulse 72   Temp (!) 97.5 ?F (36.4 ?C) (Oral)   Resp 16   Ht '5\' 3"'$  (1.6 m)   Wt 83.5 kg   SpO2 100%   BMI 32.59 kg/m?  ?Notable VS details: reviewed  ? ?Fetal Assessment: ?FHT:  FHR: 120 bpm, variability: moderate,  accelerations:  Present,  decelerations:  Present intermittent variables, prolong decel x 2  ?Category/reactivity:  Category II -> Category I ?UC:   regular, every 3-6 minutes ?SVE:   deferred  ?Membrane status: Intact ?Amniotic color: N/A ? ?Labs: ?Lab Results  ?Component Value Date  ? WBC 11.4 (H) 03/26/2022  ? HGB 11.7 (L) 03/26/2022  ? HCT 35.6 (L) 03/26/2022  ? MCV 94.4 03/26/2022  ? PLT 202 03/26/2022  ? ? ?Assessment / Plan: ?Induction of labor d/t postdates pregnancy ?-Oxytocin turned off d/t prolong decel ?-Increased to 2 milliunits/min prior to discontinuing  ?-I reviewed the tracing with [name] and her partner.  We discussed concern for baby tolerating labor since prolong decels and variables occur when contractions become more frequent.  I also noted that she would still need cervical ripening and she is remote from labor.  [name] and her partner are in agreement.  Risks/benefits of repeat LTCS versus continued TOLAC discussed.  After considering her options, [name] consents to repeat LTCS.  ? ?Labor:  Failed IOL d/t fetal intolerance  ?Fetal Wellbeing:  Category I ?Pain Control:  Labor support without medications ?I/D:  n/a ?Anticipated MOD:   repeat LTCS by BEB  ? ?[name] CNM ?03/26/2022, 4:13 PM ? ? ? ? ? ? ? ? ?

## 2022-03-26 NOTE — Progress Notes (Signed)
Labor Progress Note ? ?Alyssa Moore is a 31 y.o. G2P1001 at 4w6dby LMP admitted for induction of labor due to postdates pregnancy. ? ?Subjective: feeling mild cramping  ? ?Objective: ?BP 121/63 (BP Location: Left Arm)   Pulse 72   Temp (!) 97.5 ?F (36.4 ?C) (Oral)   Resp 16   Ht '5\' 3"'$  (1.6 m)   Wt 83.5 kg   SpO2 100%   BMI 32.59 kg/m?  ?Notable VS details: reviewed  ? ?Fetal Assessment: ?FHT:  FHR: 140 bpm, variability: moderate,  accelerations:  Present,  decelerations:  Present variables, lates, and prolong decel ?Category/reactivity:  Category II ?UC:   regular, every 2-4 minutes ?SVE:   1/thick/-2 ?Membrane status: Intact ?Amniotic color: N/A ? ?Labs: ?Lab Results  ?Component Value Date  ? WBC 11.4 (H) 03/26/2022  ? HGB 11.7 (L) 03/26/2022  ? HCT 35.6 (L) 03/26/2022  ? MCV 94.4 03/26/2022  ? PLT 202 03/26/2022  ? ? ?Assessment / Plan: ?Induction of labor d/t postdates pregnancy ?-Oxytocin turned off d/t persistent Cat II tracing  ?I reviewed the tracing with Alyssa Moore and her partner.  We discussed the concern of decels on low dose of oxytocin and signs of fetal intolerance prior to onset of labor.  EFM now reassuring and Cat I after turning oxytocin off. Options given to try restarting oxytocin, attempting AROM/IUPC/Amnioinfusion, or proceeding with repeat cesarean birth.  I reviewed that AROM is likely to be difficult at this time since her cervix is only 1cm dilated.  Labor is likely to take on average 12-18 hours and I am concerned that her baby will tolerate a long induction.  We have signs of reassurance right now and all the options we discussed are reasonable.   She will discuss her options with her partner and let uKoreaknow what she would like to proceed with.   ? ?Labor:  Cervical ripening with oxytocin - off currently d/t persistent Cat II tracing  ?Fetal Wellbeing:  Category II -> now Category I after oxytocin turned off ?Pain Control:  Labor support without medications ?I/D:   n/a ?Anticipated MOD:   TBD based on patient preference.  Can continue TOLAC if she desires or proceed with repeat LTCS.  Alyssa Moore will let her know Alyssa Moore's decision.  ? ?[name] CNM ?03/26/2022, 11:26 AM ? ? ? ? ? ? ? ? ?

## 2022-03-27 ENCOUNTER — Encounter: Payer: Self-pay | Admitting: Obstetrics and Gynecology

## 2022-03-27 LAB — CBC
HCT: 31.3 % — ABNORMAL LOW (ref 36.0–46.0)
Hemoglobin: 10.3 g/dL — ABNORMAL LOW (ref 12.0–15.0)
MCH: 31 pg (ref 26.0–34.0)
MCHC: 32.9 g/dL (ref 30.0–36.0)
MCV: 94.3 fL (ref 80.0–100.0)
Platelets: 168 10*3/uL (ref 150–400)
RBC: 3.32 MIL/uL — ABNORMAL LOW (ref 3.87–5.11)
RDW: 13.2 % (ref 11.5–15.5)
WBC: 14.9 10*3/uL — ABNORMAL HIGH (ref 4.0–10.5)
nRBC: 0 % (ref 0.0–0.2)

## 2022-03-27 NOTE — Lactation Note (Signed)
This note was copied from a baby's chart. ?Lactation Consultation Note ? ?Patient Name: Alyssa Moore ?Today's Date: 03/27/2022 ?Reason for consult: Initial assessment;Term;Other (Comment) (c-section) ?Age:33 hours ? ?Maternal Data ?Has patient been taught Hand Expression?: Yes ?Does the patient have breastfeeding experience prior to this delivery?: Yes ?How long did the patient breastfeed?: 2-3 months ? ?Feeding ?Mother's Current Feeding Choice: Breast Milk ? ?LATCH Score ?Baby sleepy; mom just attempted ? ?Lactation Tools Discussed/Used ?Tools: Pump ?Breast pump type: Manual ?Pump Education: Setup, frequency, and cleaning;Milk Storage ?Reason for Pumping: stimulation; baby sleepy ?Pumping frequency: prn ? ?Hand pump provided and mom given education on use and cleaning. Encouraged to use post feeding attempts if baby remains sleepy. ?Also encouraged hand expression for removal of colostrum. ? ?Interventions ?Interventions: Hand pump;Education ? ?Discharge ?  ? ?Consult Status ?Consult Status: Follow-up ?Date: 03/27/22 ?Follow-up type: In-patient ? ? ? ?Lavonia Drafts ?03/27/2022, 11:36 AM ? ? ? ?

## 2022-03-27 NOTE — Lactation Note (Signed)
This note was copied from a baby's chart. ?Lactation Consultation Note ? ?Patient Name: Alyssa Moore ?Today's Date: 03/27/2022 ?Reason for consult: Mother's request (Mom called lactation phone for assistance . Mom concerned baby was not getting enough.) ?Age:31 years ? ?Maternal Data ? ?This is 2nd baby for mom. She breastfed 1st baby for 2-3 months, reports challenges with milk supply. Mom called the lactation phone concerned baby is not getting enough and requested [place] assistance. ?Of note, mom with history of hypothyroidism, reports she takes daily medication and required adjustment of her medication in pregnancy. ?Has patient been taught Hand Expression?: Yes ?Does the patient have breastfeeding experience prior to this delivery?: Yes ?How long did the patient breastfeed?: 2-3 months ? ?Feeding ?Mother's Current Feeding Choice: Breast Milk ?Observed feeding. Assisted mom with maximizing positioning and latch techniques. Baby latched well. Recommended mom use breast compression/massage to encourage baby at the breast. Also, recommended mom provide some tactile stimulation to keep baby engaged. Mom was able to identify baby's swallows.Mom's nipple rounded on removal of baby from the breast to burp when he did not continue. Mom offered baby 2nd breast. He latched well had 2 swallows but was mostly non-nutritive. Mom was able to identify the difference. Despite mom coaching him he was not interested and completed the majority of his feed at the right breast. Nipple was slightlty compressed at the left breast upon removal. ?LATCH Score ?Latch: Grasps breast easily, tongue down, lips flanged, rhythmical sucking. ? ?Audible Swallowing: Spontaneous and intermittent (with coaching by mom and breast compression/massage) ? ?Type of Nipple: Everted at rest and after stimulation ? ?Comfort (Breast/Nipple): Soft / non-tender ? ?Hold (Positioning): Assistance needed to correctly position infant at breast and maintain latch.  (slight assistance to elevate baby with pillow support to insure baby's mouth was level with the nipple.) ? ?LATCH Score: 9 ? ? ?Interventions ?Interventions: Breast feeding basics reviewed;Breast massage;Breast compression;Support pillows;Education. Reviewed with parents how to know the baby is getting enough, feeds of 8-12/24 hours, what to expect day of life 2 and 3 and in the first 2 weeks of baby's life, normative cluster feeding, importance of coaching baby, and helpfulness of breast massage/breast compression.  ?Also, discussed with mom to expect breast fullness on the 3rd to 5 th day with increased audible swallowing by baby and increasing of baby's wet/stool diapers. If by day 5 she does not notice breast changes recommended mom contact her MD.Discussed with mom risk factor of hypothyroidism and potential for low milk supply if she does not note breast changes. ?Related to history of low milk supply with first baby mom understands if she wanted to post pump a few times a day after she has breastfed that could be helpful if it is not too burdensome for her. ? ?Consult Status ?Consult Status: PRN ?Date: 03/28/22 ?Follow-up type: In-patient ? ?Update provided to care nurse. ? ?Jonna Kahron Kauth ?03/27/2022, 5:38 PM ? ? ? ?

## 2022-03-27 NOTE — Lactation Note (Signed)
This note was copied from a baby's chart. ?Lactation Consultation Note ? ?Patient Name: Alyssa Moore ?Today's Date: 03/27/2022 ?Reason for consult: Follow-up assessment;Mother's request;Term (sleepy after circ) ?Age:33 hours ? ?Parents called LC requesting help with feeding attempt. ? ?Maternal Data ?Has patient been taught Hand Expression?: Yes ?Does the patient have breastfeeding experience prior to this delivery?: Yes ?How long did the patient breastfeed?: 2-3 months ? ?Feeding ?Mother's Current Feeding Choice: Breast Milk ? ?Mom had attempted to express milk with hand pump given early, drops noted in bottom of bottle. Mom praised. LC used gloved finger to give colostrum to baby and help arouse him from sleep. ? ?LATCH Score ?Latch: Repeated attempts needed to sustain latch, nipple held in mouth throughout feeding, stimulation needed to elicit sucking reflex. ? ?Audible Swallowing: A few with stimulation ? ?Type of Nipple: Everted at rest and after stimulation ? ?Comfort (Breast/Nipple): Soft / non-tender ? ?Hold (Positioning): No assistance needed to correctly position infant at breast. ? ?LATCH Score: 8 ? ?LC at mom's chair to provide support with feeding attempt post gloved finger feeding of drops of colostrum. Baby was a little more alert but still very sleepy. Mom guided through hand expressing onto tip of nipple, rubbing from nose to chin, and achieving a wide mouth/deep latch. Baby appeared to latch well after a few tries, sucks were on/off nutritive and non-nutritive, baby became sleepy quickly. ? ?Lactation Tools Discussed/Used ?Tools: Pump ?Breast pump type: Manual ?Pump Education: Setup, frequency, and cleaning;Milk Storage ?Reason for Pumping: stimulation; baby sleepy ?Pumping frequency: prn ? ?Interventions ?Interventions: Assisted with latch;Hand express;Support pillows;Hand pump;Education (gloved finger w/ expressed colostrum) ? ?Encouraged continued frequent attempts at the breast, and use of  hand expression and/or hand pump if baby is sleepy (minimum every 3 hours). ?Spoons left at bedside for continued offering of drops of colostrum while baby sleepy if needed. ? ?Discharge ?Pump: Personal (Medela) ? ?Consult Status ?Consult Status: Follow-up ?Date: 03/27/22 ?Follow-up type: Call as needed ? ? ? ?Lavonia Drafts ?03/27/2022, 12:23 PM ? ? ? ?

## 2022-03-27 NOTE — Anesthesia Postprocedure Evaluation (Signed)
Anesthesia Post Note ? ?Patient: Alyssa Moore ? ?Procedure(s) Performed: CESAREAN SECTION ? ?Patient location during evaluation: Mother Baby ?Anesthesia Type: Spinal ?Level of consciousness: oriented and awake and alert ?Pain management: pain level controlled ?Vital Signs Assessment: post-procedure vital signs reviewed and stable ?Respiratory status: spontaneous breathing and respiratory function stable ?Cardiovascular status: blood pressure returned to baseline and stable ?Postop Assessment: no headache, no backache, no apparent nausea or vomiting and able to ambulate ?Anesthetic complications: no ? ? ?No notable events documented. ? ? ?Last Vitals:  ?Vitals:  ? 03/27/22 0601 03/27/22 0742  ?BP:  100/61  ?Pulse:  (!) 59  ?Resp:  18  ?Temp:  36.6 ?C  ?SpO2: 95% 96%  ?  ?Last Pain:  ?Vitals:  ? 03/27/22 0742  ?TempSrc: Oral  ?PainSc:   ? ? ?  ?  ?  ?  ?  ?  ? ?Lia Foyer ? ? ? ? ?

## 2022-03-27 NOTE — Lactation Note (Signed)
This note was copied from a baby's chart. ?Lactation Consultation Note ? ?Patient Name: Alyssa Moore ?Today's Date: 03/27/2022 ?Reason for consult: Initial assessment;Term;Other (Comment) (c-section) ?Age:31 years ? ?Initial lactation visit. Mom is P2, rpt c-section 15hrs ago. ? ?Maternal Data ?Has patient been taught Hand Expression?: Yes ?Does the patient have breastfeeding experience prior to this delivery?: Yes ?How long did the patient breastfeed?: 2-3 months ? ?Mom remembers difficulty with breastfeeding her first (now 33yr old). She states BF went well in hospital but then baby started to lose weight once home. Ultimately decided there was a mild restriction (mom couldn't remember if it was tongue or lip; but it was not revised). She desires to breastfeed this time as well. ? ?Feeding ?Mother's Current Feeding Choice: Breast Milk ? ?LATCH Score ?  ?Baby just returned from circumcision. Mom states that she just attempted to feed (last reported feeding was 3hrs ago) but baby was extremely sleepy. ? ? ?Lactation Tools Discussed/Used ?  ? ?Interventions ?Interventions: Breast feeding basics reviewed;Hand express;Education (newborn behavior post circ) ? ?Reviewed newborn feeding patterns and behaviors, encouraged frequent breast stimulation through this sleepy time, reviewed hand expression. Reviewed output expectations and BF support available for mom today. ? ?Discharge ?  ? ?Consult Status ?Consult Status: Follow-up ?Date: 03/27/22 ?Follow-up type: In-patient ? ?[facility]. ? ?SLavonia Drafts?03/27/2022, 9:29 AM ? ? ? ?

## 2022-03-27 NOTE — Progress Notes (Signed)
Post Partum Day 1 ?Subjective: ?Doing well, no complaints.  Tolerating regular diet, pain with PO meds, voiding and ambulating without difficulty. ? ?No CP SOB Fever,Chills, N/V or leg pain; denies nipple or breast pain, no HA change of vision, RUQ/epigastric pain ? ?Objective: ?BP 100/61 (BP Location: Right Arm)   Pulse (!) 59   Temp 97.9 ?F (36.6 ?C) (Oral)   Resp 18   Ht '5\' 3"'$  (1.6 m)   Wt 83.5 kg   SpO2 96%   Breastfeeding Unknown   BMI 32.59 kg/m?  ?  ?Physical Exam:  ?General: NAD ?Breasts: soft/nontender ?CV: RRR ?Pulm: nl effort, CTABL ?Abdomen: soft, NT, BS x 4 ?Incision: Dsg CDI/no erythema or drainage ?Lochia: moderate ?Uterine Fundus: fundus firm and 1 fb below umbilicus ?DVT Evaluation: no cords, ttp LEs  ? ?Recent Labs  ?  03/26/22 ?1749 03/27/22 ?4496  ?HGB 11.7* 10.3*  ?HCT 35.6* 31.3*  ?WBC 11.4* 14.9*  ?PLT 202 168  ? ? ?Assessment/Plan: ?31 y.o. G2P2002 postpartum day # 1 ? ?- Continue routine PP care ?- Lactation consult ?- Discussed contraceptive options including implant, IUDs hormonal and non-hormonal, injection, pills/ring/patch, condoms, and NFP.  ?- Acute blood loss anemia - hemodynamically stable and asymptomatic; start po ferrous sulfate BID with stool softeners  ?- Immunization status:  all Imms up to date ? ?Disposition: Does not desire Dc home today.  ? ? ?Gertie Fey, CNM ?03/27/2022 ?8:24 AM ? ? ? ? ?

## 2022-03-28 ENCOUNTER — Other Ambulatory Visit: Payer: Self-pay

## 2022-03-28 LAB — TYPE AND SCREEN
ABO/RH(D): O POS
Antibody Screen: POSITIVE
Unit division: 0
Unit division: 0

## 2022-03-28 LAB — BPAM RBC
Blood Product Expiration Date: 202305082359
Blood Product Expiration Date: 202305082359
Unit Type and Rh: 5100
Unit Type and Rh: 5100

## 2022-03-28 MED ORDER — GABAPENTIN 300 MG PO CAPS
300.0000 mg | ORAL_CAPSULE | Freq: Every day | ORAL | 0 refills | Status: DC
  Filled 2022-03-28: qty 3, 3d supply, fill #0

## 2022-03-28 MED ORDER — COCONUT OIL OIL: 1.0000 "application " | TOPICAL_OIL | 0 refills | Status: DC | PRN

## 2022-03-28 MED ORDER — OXYCODONE HCL 5 MG PO TABS
5.0000 mg | ORAL_TABLET | Freq: Four times a day (QID) | ORAL | 0 refills | Status: AC | PRN
  Filled 2022-03-28: qty 28, 7d supply, fill #0

## 2022-03-28 MED ORDER — FERROUS SULFATE 325 (65 FE) MG PO TABS
325.0000 mg | ORAL_TABLET | Freq: Two times a day (BID) | ORAL | 0 refills | Status: DC
Start: 2022-03-28 — End: 2022-11-23
  Filled 2022-03-28: qty 60, 30d supply, fill #0

## 2022-03-28 MED ORDER — IBUPROFEN 600 MG PO TABS
600.0000 mg | ORAL_TABLET | Freq: Four times a day (QID) | ORAL | 0 refills | Status: DC
  Filled 2022-03-28: qty 90, 23d supply, fill #0

## 2022-03-28 MED ORDER — DIPHENHYDRAMINE HCL 25 MG PO CAPS: 25.0000 mg | ORAL_CAPSULE | Freq: Four times a day (QID) | ORAL | 0 refills | Status: DC | PRN

## 2022-03-28 MED ORDER — SIMETHICONE 80 MG PO CHEW
80.0000 mg | CHEWABLE_TABLET | Freq: Three times a day (TID) | ORAL | 0 refills | Status: DC
  Filled 2022-03-28: qty 30, 10d supply, fill #0

## 2022-03-28 MED ORDER — SENNOSIDES-DOCUSATE SODIUM 8.6-50 MG PO TABS
2.0000 | ORAL_TABLET | ORAL | 0 refills | Status: DC
  Filled 2022-03-28: qty 30, 15d supply, fill #0

## 2022-03-28 NOTE — Lactation Note (Signed)
This note was copied from a baby's chart. ?Lactation Consultation Note ? ?Patient Name: Alyssa Moore ?Today's Date: 03/28/2022 ?Reason for consult: Term;Follow-up assessment;Maternal endocrine disorder ?Age:31 years ? ? ? ?P2 mom preparing for discharge as [employer] student entered the room.  Reviewed basic newborn behavior and outpatient resources.  Reviewed when to pump.  All questions answered prior to [employer] student leaving the room.   ? ?Feeding plan: ? Continue to feed infant 8+ times in 24 hours based on hunger cues. ?Continue STS ? ?Feeding ?Mother's Current Feeding Choice: Breast Milk ? ? ?Interventions ?Interventions: Breast feeding basics reviewed;Skin to skin;Education ? ?Discharge ?Discharge Education: Engorgement and breast care;Warning signs for feeding baby ?Pump: Personal ([employer] employee recieved a madela pump at discharge) ? ?Consult Status ?Consult Status: Complete ?Date: 03/28/22 ?Follow-up type: Call as needed ? ? ? ?Forrestine Him ?03/28/2022, 10:20 AM ? ? ? ?

## 2022-04-24 ENCOUNTER — Other Ambulatory Visit: Payer: Self-pay

## 2022-04-24 MED ORDER — LEVOTHYROXINE SODIUM 100 MCG PO TABS
ORAL_TABLET | ORAL | 0 refills | Status: DC
Start: 2022-04-24 — End: 2023-03-03
  Filled 2022-04-24: qty 30, 30d supply, fill #0

## 2022-06-25 ENCOUNTER — Other Ambulatory Visit: Payer: Self-pay

## 2022-06-25 MED ORDER — LEVOTHYROXINE SODIUM 75 MCG PO TABS
ORAL_TABLET | ORAL | 1 refills | Status: DC
  Filled 2022-06-25: qty 30, 30d supply, fill #0
  Filled 2022-07-24: qty 30, 30d supply, fill #1

## 2022-07-24 ENCOUNTER — Other Ambulatory Visit: Payer: Self-pay

## 2022-09-09 ENCOUNTER — Other Ambulatory Visit: Payer: Self-pay

## 2022-09-09 MED ORDER — LEVOTHYROXINE SODIUM 75 MCG PO TABS
ORAL_TABLET | ORAL | 2 refills | Status: DC
  Filled 2022-09-09: qty 30, 30d supply, fill #0
  Filled 2022-09-09: qty 30, 90d supply, fill #0
  Filled 2022-12-22: qty 30, 90d supply, fill #1
  Filled 2023-01-27 – 2023-07-20 (×2): qty 30, 90d supply, fill #2

## 2022-09-09 MED ORDER — LEVOTHYROXINE SODIUM 100 MCG PO TABS
ORAL_TABLET | ORAL | 0 refills | Status: DC
  Filled 2022-09-09: qty 30, 30d supply, fill #0

## 2022-09-09 MED ORDER — LEVOTHYROXINE SODIUM 75 MCG PO TABS
ORAL_TABLET | ORAL | 2 refills | Status: DC
  Filled 2022-09-09: qty 30, 30d supply, fill #0

## 2022-11-21 ENCOUNTER — Other Ambulatory Visit: Payer: Self-pay

## 2022-11-21 MED ORDER — NORGESTIM-ETH ESTRAD TRIPHASIC 0.18/0.215/0.25 MG-35 MCG PO TABS
1.0000 | ORAL_TABLET | Freq: Every day | ORAL | 3 refills | Status: DC
  Filled 2022-11-21: qty 84, 84d supply, fill #0
  Filled 2023-02-10: qty 84, 84d supply, fill #1
  Filled 2023-05-05: qty 84, 84d supply, fill #2
  Filled 2023-07-28: qty 84, 84d supply, fill #3

## 2022-11-23 ENCOUNTER — Ambulatory Visit (INDEPENDENT_AMBULATORY_CARE_PROVIDER_SITE_OTHER): Payer: No Typology Code available for payment source

## 2022-11-23 ENCOUNTER — Ambulatory Visit
Admission: EM | Admit: 2022-11-23 | Discharge: 2022-11-23 | Disposition: A | Discharge status: Home / Self Care | Payer: No Typology Code available for payment source | Attending: Internal Medicine | Admitting: Internal Medicine

## 2022-11-23 DIAGNOSIS — S86112A Strain of other muscle(s) and tendon(s) of posterior muscle group at lower leg level, left leg, initial encounter: Secondary | ICD-10-CM

## 2022-11-23 DIAGNOSIS — M79662 Pain in left lower leg: Secondary | ICD-10-CM | POA: Diagnosis not present

## 2022-11-23 NOTE — ED Provider Notes (Signed)
[facility]    CSN: [id number] Arrival date & time: 11/23/22  1131      History   Chief Complaint Chief Complaint  Patient presents with   Leg Injury   Leg Pain    HPI Alyssa Moore is a 31 y.o. female who presens with L calf pain and swelling which occurred as she was walking down some steps and felt a pop on the area of pain right away. Denies of being on antibiotics in the past month. She has not iced it or used any meds for pain.     Past Medical History:  Diagnosis Date   Hypothyroidism     Patient Active Problem List   Diagnosis Date Noted   Encounter for planned induction of labor 03/26/2022   Previous cesarean section 01/15/2022   Supervision of high risk pregnancy in third trimester 08/21/2021   Hypothyroidism (acquired) 06/09/2018    Past Surgical History:  Procedure Laterality Date   CESAREAN SECTION N/A 11/03/2018   Procedure: CESAREAN SECTION;  Surgeon: Ward, Honor Loh, MD;  Location: [facility] ORS;  Service: Obstetrics;  Laterality: N/A;   CESAREAN SECTION  03/26/2022   Procedure: CESAREAN SECTION;  Surgeon: Benjaman Kindler, MD;  Location: [facility] ORS;  Service: Obstetrics;;   DILATION AND CURETTAGE OF UTERUS N/A 05/07/2021   Procedure: SUCTION D&C;  Surgeon: Benjaman Kindler, MD;  Location: [facility] ORS;  Service: Gynecology;  Laterality: N/A;   EYE SURGERY     FRACTURE SURGERY     WISDOM TOOTH EXTRACTION      OB History     Gravida  2   Para  2   Term  2   Preterm      AB      Living  2      SAB      IAB      Ectopic      Multiple  0   Live Births  2            Home Medications    Prior to Admission medications   Medication Sig Start Date End Date Taking? Authorizing Provider  levothyroxine (SYNTHROID) 100 MCG tablet Take 1 tablet (100 mcg total) by mouth once daily Take on an empty stomach with a glass of water at least 30-60 minutes before breakfast. 04/24/22     levothyroxine (SYNTHROID) 75 MCG tablet Take 1  tablet (75 mcg total) by mouth once daily Take on an empty stomach with a glass of water at least 30-60 minutes before breakfast on Mondays and Fridays (176mg all other days) 09/09/22     Norgestimate-Ethinyl Estradiol Triphasic 0.18/0.215/0.25 MG-35 MCG tablet Take 1 tablet by mouth once daily 11/21/22       Family History Family History  Problem Relation Age of Onset   Healthy Mother    Lymphoma Father    Melanoma Father    Heart failure Father     Social History Social History   Tobacco Use   Smoking status: Former   Smokeless tobacco: Never  VScientific laboratory technicianUse: Never used  Substance Use Topics   Alcohol use: Yes   Drug use: Never     Allergies   Patient has no known allergies.   Review of Systems Review of Systems  Musculoskeletal:  Positive for gait problem.       L calf pain and swelling  Skin:  Negative for color change, pallor, rash and wound.     Physical Exam Triage  Vital Signs ED Triage Vitals  Enc Vitals Group     BP 11/23/22 1235 112/66     Pulse Rate 11/23/22 1235 62     Resp 11/23/22 1235 18     Temp 11/23/22 1235 97.9 F (36.6 C)     Temp Source 11/23/22 1235 Oral     SpO2 11/23/22 1235 100 %     Weight 11/23/22 1232 148 lb (67.1 kg)     Height 11/23/22 1232 '5\' 3"'$  (1.6 m)     Head Circumference --      Peak Flow --      Pain Score 11/23/22 1232 8     Pain Loc --      Pain Edu? --      Excl. in [place]? --    No data found.  Updated Vital Signs BP 112/66 (BP Location: Left Arm)   Pulse 62   Temp 97.9 F (36.6 C) (Oral)   Resp 18   Ht '5\' 3"'$  (1.6 m)   Wt 148 lb (67.1 kg)   LMP 11/23/2022   SpO2 100%   BMI 26.22 kg/m   Visual Acuity Right Eye Distance:   Left Eye Distance:   Bilateral Distance:    Right Eye Near:   Left Eye Near:    Bilateral Near:     Physical Exam Vitals and nursing note reviewed.  Constitutional:      General: She is not in acute distress.    Appearance: Normal appearance.  HENT:     Right Ear:  External ear normal.     Left Ear: External ear normal.  Eyes:     General: No scleral icterus.    Conjunctiva/sclera: Conjunctivae normal.  Pulmonary:     Effort: Pulmonary effort is normal.  Musculoskeletal:     Cervical back: Neck supple.     Right lower leg: No edema.     Left lower leg: No edema.     Comments: L LOWER Leg- has mild swelling medially, and a couple of nodules on area of pain. The muscle is not nicely shaped as the R when she dorsiflex her foot which causes her a lot of pain. I did not palpate any cords on calf. Her achillis tendon is intact and non tender. She is walking on her L top toes, due to applying pressure on her heel causing burning pain on the calf and feels tight. There is not ecchymosis present.   Skin:    General: Skin is warm and dry.     Findings: No bruising or rash.  Neurological:     Mental Status: She is alert and oriented to person, place, and time.     Gait: Gait abnormal.  Psychiatric:        Mood and Affect: Mood normal.        Behavior: Behavior normal.        Thought Content: Thought content normal.        Judgment: Judgment normal.      UC Treatments / Results  Labs (all labs ordered are listed, but only abnormal results are displayed) Labs Reviewed - No data to display  EKG   Radiology DG Tibia/Fibula Left  Result Date: 11/23/2022 CLINICAL DATA:  Calf pain.  Swelling. EXAM: LEFT TIBIA AND FIBULA - 2 VIEW COMPARISON:  None Available. FINDINGS: There is no evidence of fracture or other focal bone lesions. Soft tissues are unremarkable. IMPRESSION: Negative. Electronically Signed   By: Queen Slough.D.  On: 11/23/2022 13:03    Procedures Procedures (including critical care time)  Medications Ordered in UC Medications - No data to display  Initial Impression / Assessment and Plan / UC Course  I have reviewed the triage vital signs and the nursing notes.  Pertinent  imaging tib/fib results that were available during my  care of the patient were reviewed by me and considered in my medical decision making (see chart for details).  Partial L Gastrocnemius muscle rupture  She was placed on a long walker boot and crutches Needs to Fu with ortho after PCP makes the referral Monday  Advised to take Ibuprofen and Ice area. See instructions.    Final Clinical Impressions(s) / UC Diagnoses   Final diagnoses:  Gastrocnemius muscle rupture, left, initial encounter     Discharge Instructions      Ice are a of pain for 20 minutes 2-4 times a day for the first 48 hours after injury You may take Ibuprofen up to 800 mg every 8 hours for pain.  Follow up with ortho next week      ED Prescriptions   None    PDMP not reviewed this encounter.   Shelby Mattocks, PA-C 11/23/22 1429

## 2022-11-23 NOTE — ED Triage Notes (Signed)
Pt c/o left calf pain x1day  Pt states that she was walking down the stairs and felt a pop. Pt now has left calf swelling and pain.  Pt states that it felt like a rubber band popped.   Pt denies any physical activity or change of routine.

## 2022-11-23 NOTE — Discharge Instructions (Addendum)
Ice are a of pain for 20 minutes 2-4 times a day for the first 48 hours after injury You may take Ibuprofen up to 800 mg every 8 hours for pain.  Follow up with ortho next week

## 2023-01-27 ENCOUNTER — Other Ambulatory Visit: Payer: Self-pay

## 2023-02-10 ENCOUNTER — Other Ambulatory Visit: Payer: Self-pay

## 2023-02-28 ENCOUNTER — Other Ambulatory Visit: Payer: Self-pay

## 2023-03-03 ENCOUNTER — Other Ambulatory Visit: Payer: Self-pay

## 2023-03-03 MED ORDER — LEVOTHYROXINE SODIUM 100 MCG PO TABS
ORAL_TABLET | ORAL | 0 refills | Status: DC
  Filled 2023-03-03: qty 30, 30d supply, fill #0

## 2023-03-12 ENCOUNTER — Telehealth: Payer: 59 | Admitting: Physician Assistant

## 2023-03-12 DIAGNOSIS — H1033 Unspecified acute conjunctivitis, bilateral: Secondary | ICD-10-CM | POA: Diagnosis not present

## 2023-03-12 MED ORDER — POLYMYXIN B-TRIMETHOPRIM 10000-0.1 UNIT/ML-% OP SOLN: OPHTHALMIC | 0 refills | Status: DC

## 2023-03-12 NOTE — Progress Notes (Signed)

## 2023-03-12 NOTE — Progress Notes (Signed)
I have spent 5 minutes in review of e-visit questionnaire, review and updating patient chart, medical decision making and response to patient.   Jonnell Hentges Cody Janira Mandell, PA-C    

## 2023-04-11 ENCOUNTER — Ambulatory Visit
Admission: EM | Admit: 2023-04-11 | Discharge: 2023-04-11 | Disposition: A | Discharge status: Home / Self Care | Payer: 59 | Attending: Urgent Care | Admitting: Urgent Care

## 2023-04-11 DIAGNOSIS — J020 Streptococcal pharyngitis: Secondary | ICD-10-CM | POA: Diagnosis not present

## 2023-04-11 LAB — POCT RAPID STREP A (OFFICE): Rapid Strep A Screen: POSITIVE — AB

## 2023-04-11 MED ORDER — AMOXICILLIN 500 MG PO CAPS: 500.0000 mg | ORAL_CAPSULE | Freq: Two times a day (BID) | ORAL | 0 refills | Status: AC

## 2023-04-11 NOTE — ED Provider Notes (Signed)
[name]    CSN: [id number] Arrival date & time: 04/11/23  1714      History   Chief Complaint Chief Complaint  Patient presents with   Sore Throat    HPI Alyssa Moore is a 32 y.o. female.    Sore Throat    Presents to urgent care with complaint of sore throat starting yesterday.  Worsening symptoms today.  Past Medical History:  Diagnosis Date   Hypothyroidism     Patient Active Problem List   Diagnosis Date Noted   Encounter for planned induction of labor 03/26/2022   Previous cesarean section 01/15/2022   Supervision of high risk pregnancy in third trimester 08/21/2021   Hypothyroidism (acquired) 06/09/2018    Past Surgical History:  Procedure Laterality Date   CESAREAN SECTION N/A 11/03/2018   Procedure: CESAREAN SECTION;  Surgeon: Ward, Elenora Fender, MD;  Location: [facility] ORS;  Service: Obstetrics;  Laterality: N/A;   CESAREAN SECTION  03/26/2022   Procedure: CESAREAN SECTION;  Surgeon: Christeen Douglas, MD;  Location: [facility] ORS;  Service: Obstetrics;;   DILATION AND CURETTAGE OF UTERUS N/A 05/07/2021   Procedure: SUCTION D&C;  Surgeon: Christeen Douglas, MD;  Location: [facility] ORS;  Service: Gynecology;  Laterality: N/A;   EYE SURGERY     FRACTURE SURGERY     WISDOM TOOTH EXTRACTION      OB History     Gravida  2   Para  2   Term  2   Preterm      AB      Living  2      SAB      IAB      Ectopic      Multiple  0   Live Births  2            Home Medications    Prior to Admission medications   Medication Sig Start Date End Date Taking? Authorizing Provider  levothyroxine (SYNTHROID) 100 MCG tablet Take 1 tablet (100 mcg total) by mouth once daily Take on an empty stomach with a glass of water at least 30-60 minutes before breakfast. 03/03/23     levothyroxine (SYNTHROID) 75 MCG tablet Take 1 tablet (75 mcg total) by mouth once daily Take on an empty stomach with a glass of water at least 30-60 minutes before breakfast on  Mondays and Fridays ( all other days) 09/09/22     Norgestimate-Ethinyl Estradiol Triphasic 0.18/0.215/0.25 MG-35 MCG tablet Take 1 tablet by mouth once daily 11/21/22     trimethoprim-polymyxin b (POLYTRIM) ophthalmic solution Apply 1-2 drops into affected eye QID x 5 days. 03/12/23   Waldon Merl, PA-C    Family History Family History  Problem Relation Age of Onset   Healthy Mother    Lymphoma Father    Melanoma Father    Heart failure Father     Social History Social History   Tobacco Use   Smoking status: Former   Smokeless tobacco: Never  Building services engineer Use: Never used  Substance Use Topics   Alcohol use: Yes   Drug use: Never     Allergies   Patient has no known allergies.   Review of Systems Review of Systems   Physical Exam Triage Vital Signs ED Triage Vitals [04/11/23 1739]  Enc Vitals Group     BP      Pulse      Resp      Temp      Temp  src      SpO2      Weight      Height      Head Circumference      Peak Flow      Pain Score 5     Pain Loc      Pain Edu?      Excl. in [place]?    No data found.  Updated Vital Signs There were no vitals taken for this visit.  Visual Acuity Right Eye Distance:   Left Eye Distance:   Bilateral Distance:    Right Eye Near:   Left Eye Near:    Bilateral Near:     Physical Exam Vitals reviewed.  Constitutional:      Appearance: She is well-developed.  HENT:     Mouth/Throat:     Pharynx: Posterior oropharyngeal erythema present. No oropharyngeal exudate.     Tonsils: No tonsillar exudate.  Skin:    General: Skin is warm and dry.  Neurological:     General: No focal deficit present.     Mental Status: She is alert and oriented to person, place, and time.  Psychiatric:        Mood and Affect: Mood normal.        Behavior: Behavior normal.      UC Treatments / Results  Labs (all labs ordered are listed, but only abnormal results are displayed) Labs Reviewed  POCT RAPID STREP A  (OFFICE)    EKG   Radiology No results found.  Procedures Procedures (including critical care time)  Medications Ordered in UC Medications - No data to display  Initial Impression / Assessment and Plan / UC Course  I have reviewed the triage vital signs and the nursing notes.  Pertinent labs & imaging results that were available during my care of the patient were reviewed by me and considered in my medical decision making (see chart for details).   [name] is a 32 y.o. female presenting with sore throat. Patient is afebrile without recent antipyretics, satting well on room air. Overall is well appearing and non-toxic, well hydrated, without respiratory distress.  Mild pharyngeal erythema.  No peritonsillar exudates.  Rapid strep is positive.  Treating with amoxicillin.  Supportive care and use of OTC medication for symptom control is recommended.  Counseled patient on potential for adverse effects with medications prescribed/recommended today, ER and return-to-clinic precautions discussed, patient verbalized understanding and agreement with care plan.   Final Clinical Impressions(s) / UC Diagnoses   Final diagnoses:  None   Discharge Instructions   None    ED Prescriptions   None    PDMP not reviewed this encounter.   Charma Igo, [place] 04/11/23 1755

## 2023-04-11 NOTE — Discharge Instructions (Signed)
Follow up here or with your primary care provider if your symptoms are worsening or not improving.     

## 2023-04-11 NOTE — ED Triage Notes (Signed)
Patient presents to [facility] for sore throat since yesterday. Worse today. Taking ibuprofen and allergy meds.

## 2023-05-05 ENCOUNTER — Other Ambulatory Visit: Payer: Self-pay

## 2023-07-15 ENCOUNTER — Ambulatory Visit (INDEPENDENT_AMBULATORY_CARE_PROVIDER_SITE_OTHER): Payer: 59 | Admitting: Family Medicine

## 2023-07-15 ENCOUNTER — Encounter: Payer: Self-pay | Admitting: Family Medicine

## 2023-07-15 VITALS — BP 108/80 | HR 80 | Ht 63.0 in | Wt 141.0 lb

## 2023-07-15 DIAGNOSIS — Z7689 Persons encountering health services in other specified circumstances: Secondary | ICD-10-CM | POA: Insufficient documentation

## 2023-07-15 DIAGNOSIS — Z0000 Encounter for general adult medical examination without abnormal findings: Secondary | ICD-10-CM | POA: Insufficient documentation

## 2023-07-15 DIAGNOSIS — E039 Hypothyroidism, unspecified: Secondary | ICD-10-CM

## 2023-07-15 NOTE — Assessment & Plan Note (Addendum)
Diagnosed as part of routine workup preceding first pregnancy.  Has been on varying doses of Synthroid, currently on 75 mcg daily.  Denies any active symptoms.  Examination without thyromegaly, thyroid is soft, nonpalpable, no nodularity.  Clear lung fields bilaterally without wheezes, rales, rhonchi, cardiac exam with positive S1-S2, regular rate and rhythm, no additional heart sounds.  Plan as follows: - Patient will continue current Synthroid 75 mcg dose - Close follow-up for annual physical where thyroid function testing labs to be coordinated

## 2023-07-15 NOTE — Patient Instructions (Signed)
-   Continue levothyroxine 75 mcg daily on empty stomach - Maintain follow-up with your gynecologist - Return for annual physical - Contact us for any question/concerns between now and

## 2023-07-15 NOTE — Assessment & Plan Note (Signed)
Patient presents to establish care, we extensively reviewed patient's past medical, surgical, and family history.  Additionally reviewed current medications and plans for close follow-up for annual physical exam.

## 2023-07-15 NOTE — Progress Notes (Signed)
     Primary Care / Sports Medicine Office Visit  Patient Information:  Patient ID: Alyssa Moore, female DOB: 23-Jun-1991 Age: 32 y.o. MRN: [id number]   Alyssa Moore is a pleasant 32 y.o. female presenting with the following:  Chief Complaint  Patient presents with   Establish Care    Vitals:   07/15/23 0940  BP: 108/80  Pulse: 80  SpO2: 98%   Vitals:   07/15/23 0940  Weight: 141 lb (64 kg)  Height: 5\' 3"  (1.6 m)   Body mass index is 24.98 kg/m.  No results found.   Independent interpretation of notes and tests performed by another provider:   None  Procedures performed:   None  Pertinent History, Exam, Impression, and Recommendations:   Alyssa Moore was seen today for establish care.  Hypothyroidism, unspecified type Assessment & Plan: Diagnosed as part of routine workup preceding first pregnancy.  Has been on varying doses of Synthroid, currently on 75 mcg daily.  Denies any active symptoms.  Examination without thyromegaly, thyroid is soft, nonpalpable, no nodularity.  Clear lung fields bilaterally without wheezes, rales, rhonchi, cardiac exam with positive S1-S2, regular rate and rhythm, no additional heart sounds.  Plan as follows: - Patient will continue current Synthroid 75 mcg dose - Close follow-up for annual physical where thyroid function testing labs to be coordinated   Encounter to establish care with new doctor Assessment & Plan: Patient presents to establish care, we extensively reviewed patient's past medical, surgical, and family history.  Additionally reviewed current medications and plans for close follow-up for annual physical exam.    I provided a total time of 31 minutes including both face-to-face and non-face-to-face time on 07/15/2023 inclusive of time utilized for medical chart review, information gathering, care coordination with staff, and documentation completion.   Orders & Medications No orders of the defined types were placed  in this encounter.  No orders of the defined types were placed in this encounter.    No follow-ups on file.     Jerrol Banana, MD, [facility]   Primary Care Sports Medicine Primary Care and Sports Medicine at [facility]

## 2023-07-21 ENCOUNTER — Other Ambulatory Visit: Payer: Self-pay

## 2023-07-29 ENCOUNTER — Encounter: Payer: Self-pay | Admitting: Family Medicine

## 2023-07-29 ENCOUNTER — Ambulatory Visit (INDEPENDENT_AMBULATORY_CARE_PROVIDER_SITE_OTHER): Payer: 59 | Admitting: Family Medicine

## 2023-07-29 VITALS — BP 110/78 | HR 80 | Ht 63.0 in | Wt 138.0 lb

## 2023-07-29 DIAGNOSIS — Z13220 Encounter for screening for lipoid disorders: Secondary | ICD-10-CM | POA: Diagnosis not present

## 2023-07-29 DIAGNOSIS — Z0000 Encounter for general adult medical examination without abnormal findings: Secondary | ICD-10-CM | POA: Diagnosis not present

## 2023-07-29 DIAGNOSIS — D229 Melanocytic nevi, unspecified: Secondary | ICD-10-CM

## 2023-07-29 DIAGNOSIS — E039 Hypothyroidism, unspecified: Secondary | ICD-10-CM

## 2023-07-29 DIAGNOSIS — Z1159 Encounter for screening for other viral diseases: Secondary | ICD-10-CM

## 2023-07-29 DIAGNOSIS — E559 Vitamin D deficiency, unspecified: Secondary | ICD-10-CM

## 2023-07-29 NOTE — Assessment & Plan Note (Signed)
Annual examination completed, risk stratification labs ordered, anticipatory guidance provided.  We will follow labs once resulted. 

## 2023-07-29 NOTE — Assessment & Plan Note (Signed)
Updated labs ordered today, will await results to dictate dose adjustment

## 2023-07-29 NOTE — Assessment & Plan Note (Signed)
Has previously established with dermatology. Has plans to follow-up for annual skin exam.

## 2023-07-29 NOTE — Patient Instructions (Addendum)
-   Obtain fasting labs with orders provided (can have water or black coffee but otherwise no food or drink x 8 hours before labs) - Review information provided - Attend eye doctor annually, dentist every 6 months, work towards or maintain 30 minutes of moderate intensity physical activity at least 5 days per week, and consume a balanced diet - Return in 1 year for physical - Contact us for any questions between now and then  Additionally: - Follow-up with dermatology for routine skin check (contact if needing referral)

## 2023-07-29 NOTE — Progress Notes (Signed)
Annual Physical Exam Visit  Patient Information:  Patient ID: Alyssa Moore, female DOB: 05/22/1991 Age: 32 y.o. MRN: [id number]   Subjective:   CC: Annual Physical Exam  HPI:  Alyssa Moore is here for their annual physical.  I reviewed the past medical history, family history, social history, surgical history, and allergies today and changes were made as necessary.  Please see the problem list section below for additional details.  Past Medical History: Past Medical History:  Diagnosis Date   Hypothyroidism 2019   Noted during routine labs   Previous cesarean section 01/15/2022   Supervision of high risk pregnancy in third trimester 08/21/2021   Formatting of this note might be different from the original.  31 y.o. G3P1001 at [redacted]w[redacted]d Patient's last menstrual period was 06/13/2021 (exact date). consistent with  with ultrasound @ 9.6 weeks .Estimated Date of Delivery: 03/20/21.  Sex of baby and name:  Boy " "   FOB:         Factors complicating this pregnancy   1. Previous C/S x_1, 11/2019_    C/s done for:  Breech presentation   [x]  Request   Past Surgical History: Past Surgical History:  Procedure Laterality Date   CESAREAN SECTION N/A 11/03/2018   Procedure: CESAREAN SECTION;  Surgeon: Ward, Elenora Fender, MD;  Location: [facility];  Service: Obstetrics;  Laterality: N/A;   CESAREAN SECTION  03/26/2022   Procedure: CESAREAN SECTION;  Surgeon: Christeen Douglas, MD;  Location: [facility];  Service: Obstetrics;;   DILATION AND CURETTAGE OF UTERUS N/A 05/07/2021   Procedure: SUCTION D&C;  Surgeon: Christeen Douglas, MD;  Location: [facility];  Service: Gynecology;  Laterality: N/A;   EYE SURGERY Bilateral 2012   laser eye   FRACTURE SURGERY Right 2002   wrist - no hardware   WISDOM TOOTH EXTRACTION Bilateral 2010   Family History: Family History  Problem Relation Age of Onset   Healthy Mother    Lymphoma Father 108   Melanoma Father        excised   Heart failure Father 5        Passed age 64   Valvular heart disease Father        replacement   Thyroid disease Paternal Grandmother    Allergies: No Known Allergies Health Maintenance: Health Maintenance  Topic Date Due   Hepatitis C Screening  Never done   PAP SMEAR-Modifier  Never done   INFLUENZA VACCINE  07/17/2023   DTaP/Tdap/Td (2 - Td or Tdap) 09/22/2028   HIV Screening  Completed   HPV VACCINES  Aged Out   COVID-19 Vaccine  Discontinued    HM Colonoscopy     This patient has no relevant Health Maintenance data.      Medications: Current Outpatient Medications on File Prior to Visit  Medication Sig Dispense Refill   levothyroxine (SYNTHROID) 75 MCG tablet Take 1 tablet (75 mcg total) by mouth once daily Take on an empty stomach with a glass of water at least 30-60 minutes before breakfast on Mondays and Fridays ( all other days) 30 tablet 2   Norgestimate-Ethinyl Estradiol Triphasic 0.18/0.215/0.25 MG-35 MCG tablet Take 1 tablet by mouth once daily 84 tablet 3   No current facility-administered medications on file prior to visit.    Review of Systems: No headache, visual changes, nausea, vomiting, diarrhea, constipation, dizziness, abdominal pain, skin rash, fevers, chills, night sweats, swollen lymph nodes, weight loss, chest pain, body aches, joint swelling, muscle aches, shortness  of breath, mood changes, visual or auditory hallucinations reported.  Objective:   Vitals:   07/29/23 0805  BP: 110/78  Pulse: 80  SpO2: 98%   Vitals:   07/29/23 0805  Weight: 138 lb (62.6 kg)  Height: 5\' 3"  (1.6 m)   Body mass index is 24.45 kg/m.  General: Well Developed, well nourished, and in no acute distress.  Neuro: Alert and oriented x3, extra-ocular muscles intact, sensation grossly intact. Cranial nerves II through XII are grossly intact, motor, sensory, and coordinative functions are intact. HEENT: Normocephalic, atraumatic, pupils equal round reactive to light, neck supple, no masses,  no lymphadenopathy, thyroid nonpalpable. Oropharynx, nasopharynx, external ear canals are unremarkable. Skin: Warm and dry, no rashes noted. Multiple scattered benign appearing nevi. Cardiac: Regular rate and rhythm, no murmurs rubs or gallops. No peripheral edema. Pulses symmetric. Respiratory: Clear to auscultation bilaterally. Not using accessory muscles, speaking in full sentences.  Abdominal: Soft, nontender, nondistended, positive bowel sounds, no masses, no organomegaly. Musculoskeletal: Shoulder, elbow, wrist, hip, knee, ankle stable, and with full range of motion.  Female chaperone initials: AP present throughout the physical examination.  Impression and Recommendations:   The patient was counselled, risk factors were discussed, and anticipatory guidance given.  Problem List Items Addressed This Visit       Endocrine   Hypothyroidism    Updated labs ordered today, will await results to dictate dose adjustment      Relevant Orders   TSH   T4, free   T3, free   TSH   T4, free   T3, free     Other   Healthcare maintenance - Primary    Annual examination completed, risk stratification labs ordered, anticipatory guidance provided.  We will follow labs once resulted.      Relevant Orders   CBC   Comprehensive metabolic panel   Hepatitis C antibody   Lipid panel   TSH   VITAMIN D 25 Hydroxy (Vit-D Deficiency, Fractures)   T4, free   T3, free   CBC   Comprehensive metabolic panel   HIV Antibody (routine testing w rflx)   Hepatitis C antibody   Lipid panel   TSH   VITAMIN D 25 Hydroxy (Vit-D Deficiency, Fractures)   T4, free   T3, free   Other Visit Diagnoses     Need for hepatitis C screening test       Relevant Orders   Hepatitis C antibody   Hepatitis C antibody   Screening for lipoid disorders       Relevant Orders   Comprehensive metabolic panel   Lipid panel   Comprehensive metabolic panel   Lipid panel   Annual physical exam       Relevant  Orders   CBC   Comprehensive metabolic panel   Hepatitis C antibody   Lipid panel   TSH   VITAMIN D 25 Hydroxy (Vit-D Deficiency, Fractures)   T4, free   T3, free   CBC   Comprehensive metabolic panel   HIV Antibody (routine testing w rflx)   Hepatitis C antibody   Lipid panel   TSH   VITAMIN D 25 Hydroxy (Vit-D Deficiency, Fractures)   T4, free   T3, free   Vitamin D deficiency       Relevant Orders   VITAMIN D 25 Hydroxy (Vit-D Deficiency, Fractures)   VITAMIN D 25 Hydroxy (Vit-D Deficiency, Fractures)        Orders & Medications Medications: No orders of the defined types  were placed in this encounter.  Orders Placed This Encounter  Procedures   CBC   Comprehensive metabolic panel   Hepatitis C antibody   Lipid panel   TSH   VITAMIN D 25 Hydroxy (Vit-D Deficiency, Fractures)   T4, free   T3, free   CBC   Comprehensive metabolic panel   HIV Antibody (routine testing w rflx)   Hepatitis C antibody   Lipid panel   TSH   VITAMIN D 25 Hydroxy (Vit-D Deficiency, Fractures)   T4, free   T3, free     Return in about 1 year (around 07/28/2024).    Jerrol Banana, MD, [facility]   Primary Care Sports Medicine Primary Care and Sports Medicine at [facility]

## 2023-07-30 ENCOUNTER — Other Ambulatory Visit: Payer: Self-pay | Admitting: Family Medicine

## 2023-07-30 ENCOUNTER — Other Ambulatory Visit: Payer: Self-pay

## 2023-07-30 DIAGNOSIS — E039 Hypothyroidism, unspecified: Secondary | ICD-10-CM

## 2023-07-30 DIAGNOSIS — R748 Abnormal levels of other serum enzymes: Secondary | ICD-10-CM

## 2023-07-30 MED ORDER — LEVOTHYROXINE SODIUM 100 MCG PO TABS
100.0000 ug | ORAL_TABLET | Freq: Every day | ORAL | 0 refills | Status: DC
  Filled 2023-07-30: qty 90, 90d supply, fill #0

## 2023-08-20 DIAGNOSIS — Z01419 Encounter for gynecological examination (general) (routine) without abnormal findings: Secondary | ICD-10-CM | POA: Diagnosis not present

## 2023-08-20 DIAGNOSIS — Z124 Encounter for screening for malignant neoplasm of cervix: Secondary | ICD-10-CM | POA: Diagnosis not present

## 2023-10-02 ENCOUNTER — Encounter: Payer: Self-pay | Admitting: Family Medicine

## 2023-10-07 ENCOUNTER — Other Ambulatory Visit: Payer: Self-pay

## 2023-10-07 MED ORDER — NORGESTIM-ETH ESTRAD TRIPHASIC 0.18/0.215/0.25 MG-35 MCG PO TABS
1.0000 | ORAL_TABLET | Freq: Every day | ORAL | 3 refills | Status: DC
  Filled 2023-10-07: qty 84, 84d supply, fill #0
  Filled 2024-01-04: qty 84, 84d supply, fill #1
  Filled 2024-03-18: qty 84, 84d supply, fill #2
  Filled 2024-06-10: qty 84, 84d supply, fill #3

## 2023-10-08 DIAGNOSIS — E039 Hypothyroidism, unspecified: Secondary | ICD-10-CM | POA: Diagnosis not present

## 2023-10-08 DIAGNOSIS — R748 Abnormal levels of other serum enzymes: Secondary | ICD-10-CM | POA: Diagnosis not present

## 2023-10-09 LAB — COMPREHENSIVE METABOLIC PANEL
ALT: 13 [IU]/L (ref 0–32)
AST: 15 [IU]/L (ref 0–40)
Albumin: 4.3 g/dL (ref 3.9–4.9)
Alkaline Phosphatase: 35 [IU]/L — ABNORMAL LOW (ref 44–121)
BUN/Creatinine Ratio: 13 (ref 9–23)
BUN: 8 mg/dL (ref 6–20)
Bilirubin Total: <0.2 mg/dL (ref 0.0–1.2)
CO2: 24 mmol/L (ref 20–29)
Calcium: 9.3 mg/dL (ref 8.7–10.2)
Chloride: 105 mmol/L (ref 96–106)
Creatinine, Ser: 0.61 mg/dL (ref 0.57–1.00)
Globulin, Total: 2.7 g/dL (ref 1.5–4.5)
Glucose: 94 mg/dL (ref 70–99)
Potassium: 4 mmol/L (ref 3.5–5.2)
Sodium: 141 mmol/L (ref 134–144)
Total Protein: 7 g/dL (ref 6.0–8.5)
eGFR: 120 mL/min/{1.73_m2} (ref >59)

## 2023-10-09 LAB — TSH: TSH: 4.5 u[IU]/mL (ref 0.450–4.500)

## 2023-10-09 LAB — T3, FREE: T3, Free: 2.4 pg/mL (ref 2.0–4.4)

## 2023-10-09 LAB — T4, FREE: Free T4: 1.33 ng/dL (ref 0.82–1.77)

## 2023-10-24 ENCOUNTER — Other Ambulatory Visit: Payer: Self-pay

## 2023-10-24 ENCOUNTER — Other Ambulatory Visit: Payer: Self-pay | Admitting: Family Medicine

## 2023-10-24 DIAGNOSIS — E039 Hypothyroidism, unspecified: Secondary | ICD-10-CM

## 2023-10-25 ENCOUNTER — Other Ambulatory Visit: Payer: Self-pay

## 2023-10-27 ENCOUNTER — Other Ambulatory Visit: Payer: Self-pay

## 2023-10-27 MED FILL — Levothyroxine Sodium Tab 100 MCG: ORAL | 90 days supply | Qty: 90 | Fill #0 | Status: AC

## 2023-10-27 NOTE — Telephone Encounter (Signed)
Requested Prescriptions  Pending Prescriptions Disp Refills   levothyroxine (SYNTHROID) 100 MCG tablet 90 tablet 3    Sig: Take 1 tablet (100 mcg total) by mouth daily before breakfast.     Endocrinology:  Hypothyroid Agents Passed - 10/24/2023 11:11 AM      Passed - TSH in normal range and within 360 days    TSH  Date Value Ref Range Status  10/08/2023 4.500 0.450 - 4.500 uIU/mL Final         Passed - Valid encounter within last 12 months    Recent Outpatient Visits           3 months ago Healthcare maintenance   [facility] Em[facility] Loron, Ocie Bob, MD   3 months ago Hypothyroidism, unspecified type   [facility] at [facility] Loron, Ocie Bob, MD   2 years ago Hypothyroidism, unspecified type   [facility] Bosie Clos, MD   2 years ago Hypothyroidism, unspecified type   [facility] Bosie Clos, MD   3 years ago Rash and nonspecific skin eruption   [facility] Trey Sailors, [place]       Future Appointments             In 9 months Ashley Royalty, Ocie Bob, MD [facility] Primary Care & Sports Medicine at [facility], [facility]

## 2023-12-25 ENCOUNTER — Ambulatory Visit
Admission: EM | Admit: 2023-12-25 | Discharge: 2023-12-25 | Disposition: A | Discharge status: Home / Self Care | Payer: 59 | Attending: Emergency Medicine | Admitting: Emergency Medicine

## 2023-12-25 ENCOUNTER — Other Ambulatory Visit: Payer: Self-pay

## 2023-12-25 DIAGNOSIS — J069 Acute upper respiratory infection, unspecified: Secondary | ICD-10-CM | POA: Diagnosis not present

## 2023-12-25 MED ORDER — PROMETHAZINE-DM 6.25-15 MG/5ML PO SYRP
5.0000 mL | ORAL_SOLUTION | Freq: Four times a day (QID) | ORAL | 0 refills | Status: DC | PRN
  Filled 2023-12-25: qty 118, 6d supply, fill #0

## 2023-12-25 MED ORDER — AMOXICILLIN-POT CLAVULANATE 875-125 MG PO TABS
1.0000 | ORAL_TABLET | Freq: Two times a day (BID) | ORAL | 0 refills | Status: DC
Start: 2023-12-25 — End: 2024-08-03
  Filled 2023-12-25: qty 14, 7d supply, fill #0

## 2023-12-25 NOTE — Discharge Instructions (Signed)
 Take antibiotic and cough medicine as directed, push fluids, follow-up with PCP.  If you have new or worsening issues go to the emergency room for further evaluation.

## 2023-12-25 NOTE — ED Provider Notes (Signed)
 [facility]    CSN: [id number] Arrival date & time: 12/25/23  1632      History   Chief Complaint Chief Complaint  Patient presents with   Cough    HPI Alyssa Moore is a 33 y.o. female.   33 year old female, Alyssa Moore, presents to urgent care, to urgent care for cough x 2 weeks, worse at night.  Works at [employer], illness exposure. Patient states she has been taken over-the-counter meds without relief.   LMP was 12/02/2023  The history is provided by the patient. No language interpreter was used.    Past Medical History:  Diagnosis Date   Hypothyroidism 2019   Noted during routine labs   Previous cesarean section 01/15/2022   Supervision of high risk pregnancy in third trimester 08/21/2021   Formatting of this note might be different from the original.  33 y.o. G3P1001 at [redacted]w[redacted]d Patient's last menstrual period was 06/13/2021 (exact date). consistent with  with ultrasound @ 9.6 weeks .Estimated Date of Delivery: 03/20/21.  Sex of baby and name:  Boy     FOB:         Factors complicating this pregnancy   1. Previous C/S x_1, 11/2019_    C/s done for:  Breech presentation   [x]  Request    Patient Active Problem List   Diagnosis Date Noted   Acute upper respiratory infection 12/25/2023   Multiple benign nevi 07/29/2023   Healthcare maintenance 07/15/2023   Hypothyroidism 06/09/2018    Past Surgical History:  Procedure Laterality Date   CESAREAN SECTION N/A 11/03/2018   Procedure: CESAREAN SECTION;  Surgeon: Moore, Alyssa BROCKS, MD;  Location: [facility];  Service: Obstetrics;  Laterality: N/A;   CESAREAN SECTION  03/26/2022   Procedure: CESAREAN SECTION;  Surgeon: Alyssa Keen, MD;  Location: [facility];  Service: Obstetrics;;   DILATION AND CURETTAGE OF UTERUS N/A 05/07/2021   Procedure: SUCTION D&C;  Surgeon: Alyssa Keen, MD;  Location: [facility];  Service: Gynecology;  Laterality: N/A;   EYE SURGERY Bilateral 2012   laser eye   FRACTURE  SURGERY Right 2002   wrist - no hardware   WISDOM TOOTH EXTRACTION Bilateral 2010    OB History     Gravida  2   Para  2   Term  2   Preterm      AB      Living  2      SAB      IAB      Ectopic      Multiple  0   Live Births  2            Home Medications    Prior to Admission medications   Medication Sig Start Date End Date Taking? Authorizing Provider  amoxicillin -clavulanate (AUGMENTIN ) 875-125 MG tablet Take 1 tablet by mouth every 12 (twelve) hours. 12/25/23  Yes Alyssa Weightman, NP  promethazine -dextromethorphan (PROMETHAZINE -DM) 6.25-15 MG/5ML syrup Take 5 mLs by mouth 4 (four) times daily as needed for cough. 12/25/23  Yes Alyssa Bolander, NP  levothyroxine  (SYNTHROID ) 100 MCG tablet Take 1 tablet (100 mcg total) by mouth daily before breakfast. 10/27/23   Matthews, Jason J, MD  Norgestimate -Ethinyl Estradiol Triphasic 0.18/0.215/0.25 MG-35 MCG tablet Take 1 tablet by mouth daily. 10/07/23       Family History Family History  Problem Relation Age of Onset   Healthy Mother    Lymphoma Father 37   Melanoma Father  excised   Heart failure Father 64       Passed age 24   Valvular heart disease Father        replacement   Thyroid  disease Paternal Grandmother     Social History Social History   Tobacco Use   Smoking status: Former   Smokeless tobacco: Never  Advertising Account Planner   Vaping status: Never Used  Substance Use Topics   Alcohol use: Yes    Comment: social   Drug use: Never     Allergies   Patient has no known allergies.   Review of Systems Review of Systems  Constitutional:  Positive for chills.  HENT:  Positive for congestion.   Respiratory:  Positive for cough.   All other systems reviewed and are negative.    Physical Exam Triage Vital Signs ED Triage Vitals  Encounter Vitals Group     BP 12/25/23 1639 108/71     Systolic BP Percentile --      Diastolic BP Percentile --      Pulse Rate 12/25/23 1639 72      Resp 12/25/23 1639 18     Temp 12/25/23 1639 98.7 F (37.1 C)     Temp Source 12/25/23 1639 Oral     SpO2 12/25/23 1639 100 %     Weight --      Height --      Head Circumference --      Peak Flow --      Pain Score 12/25/23 1638 0     Pain Loc --      Pain Education --      Exclude from Growth Chart --    No data found.  Updated Vital Signs BP 108/71 (BP Location: Left Arm)   Pulse 72   Temp 98.7 F (37.1 C) (Oral)   Resp 18   LMP 12/02/2023   SpO2 100%   Visual Acuity Right Eye Distance:   Left Eye Distance:   Bilateral Distance:    Right Eye Near:   Left Eye Near:    Bilateral Near:     Physical Exam Vitals and nursing note reviewed.  Constitutional:      General: She is not in acute distress.    Appearance: She is well-developed and well-groomed.  HENT:     Head: Normocephalic.     Right Ear: Tympanic membrane is retracted.     Left Ear: Tympanic membrane is retracted.     Nose: Congestion present.     Mouth/Throat:     Lips: Pink.     Mouth: Mucous membranes are moist.     Pharynx: Oropharynx is clear.  Eyes:     General: Lids are normal.     Conjunctiva/sclera: Conjunctivae normal.     Pupils: Pupils are equal, round, and reactive to light.  Neck:     Trachea: No tracheal deviation.  Cardiovascular:     Rate and Rhythm: Normal rate and regular rhythm.     Pulses: Normal pulses.     Heart sounds: Normal heart sounds. No murmur heard. Pulmonary:     Effort: Pulmonary effort is normal.     Breath sounds: Normal breath sounds and air entry.  Abdominal:     General: Bowel sounds are normal.     Palpations: Abdomen is soft.     Tenderness: There is no abdominal tenderness.  Musculoskeletal:        General: Normal range of motion.     Cervical back: Normal  range of motion.  Lymphadenopathy:     Cervical: No cervical adenopathy.  Skin:    General: Skin is warm and dry.     Findings: No rash.  Neurological:     General: No focal deficit present.      Mental Status: She is alert and oriented to person, place, and time.     GCS: GCS eye subscore is 4. GCS verbal subscore is 5. GCS motor subscore is 6.  Psychiatric:        Attention and Perception: Attention normal.        Mood and Affect: Mood normal.        Speech: Speech normal.        Behavior: Behavior normal. Behavior is cooperative.     UC Treatments / Results  Labs (all labs ordered are listed, but only abnormal results are displayed) Labs Reviewed - No data to display  EKG   Radiology No results found.  Procedures Procedures (including critical care time)  Medications Ordered in UC Medications - No data to display  Initial Impression / Assessment and Plan / UC Course  I have reviewed the triage vital signs and the nursing notes.  Pertinent labs & imaging results that were available during my care of the patient were reviewed by me and considered in my medical decision making (see chart for details).    Discussed exam findings and plan of care with patient, strict go to ER precautions given, patient verbalized understanding to this provider  Ddx: Acute upper respiratory infection, viral infection , allergies Final Clinical Impressions(s) / UC Diagnoses   Final diagnoses:  Acute upper respiratory infection     Discharge Instructions      Take antibiotic and cough medicine as directed, push fluids, follow-up with PCP.  If you have new or worsening issues go to the emergency room for further evaluation.     ED Prescriptions     Medication Sig Dispense Auth. Provider   amoxicillin -clavulanate (AUGMENTIN ) 875-125 MG tablet Take 1 tablet by mouth every 12 (twelve) hours. 14 tablet Tuyet Bader, NP   promethazine -dextromethorphan (PROMETHAZINE -DM) 6.25-15 MG/5ML syrup Take 5 mLs by mouth 4 (four) times daily as needed for cough. 118 mL Aristide Waggle, NP      PDMP not reviewed this encounter.   Aminta Loose, NP 12/25/23 2002

## 2023-12-25 NOTE — ED Triage Notes (Signed)
 Patient c/o productive cough clear in color x 2 weeks.

## 2023-12-26 ENCOUNTER — Other Ambulatory Visit: Payer: Self-pay

## 2024-01-15 ENCOUNTER — Other Ambulatory Visit: Payer: Self-pay

## 2024-01-15 ENCOUNTER — Ambulatory Visit
Admission: EM | Admit: 2024-01-15 | Discharge: 2024-01-15 | Disposition: A | Discharge status: Home / Self Care | Payer: 59 | Attending: Emergency Medicine | Admitting: Emergency Medicine

## 2024-01-15 ENCOUNTER — Ambulatory Visit (INDEPENDENT_AMBULATORY_CARE_PROVIDER_SITE_OTHER): Payer: 59

## 2024-01-15 DIAGNOSIS — R109 Unspecified abdominal pain: Secondary | ICD-10-CM | POA: Diagnosis not present

## 2024-01-15 DIAGNOSIS — R079 Chest pain, unspecified: Secondary | ICD-10-CM

## 2024-01-15 DIAGNOSIS — R0789 Other chest pain: Secondary | ICD-10-CM

## 2024-01-15 DIAGNOSIS — M940 Chondrocostal junction syndrome [Tietze]: Secondary | ICD-10-CM

## 2024-01-15 DIAGNOSIS — R059 Cough, unspecified: Secondary | ICD-10-CM

## 2024-01-15 MED ORDER — METHOCARBAMOL 500 MG PO TABS
500.0000 mg | ORAL_TABLET | Freq: Two times a day (BID) | ORAL | 0 refills | Status: DC | PRN
  Filled 2024-01-15: qty 10, 5d supply, fill #0

## 2024-01-15 NOTE — Discharge Instructions (Addendum)
Follow-up with your primary care provider.  Go to the emergency department if you have persistent or worsening symptoms.    Take the muscle relaxer as directed.  Do not drive, operate machinery, drink alcohol, or perform dangerous activities while taking this medication as it may cause drowsiness.  Continue ibuprofen as directed.

## 2024-01-15 NOTE — ED Provider Notes (Signed)
[name]    CSN: [id number] Arrival date & time: 01/15/24  0847      History   Chief Complaint Chief Complaint  Patient presents with   Back Pain    HPI Alyssa Moore is a 33 y.o. female.  Patient presents with pain in her right lower chest under her breast area x 3 weeks.  The pain radiates to her right upper back.  The pain is worse with movement, coughing, palpation.  She took ibuprofen this morning which has helped.  She has been coughing for several weeks but this is improving.  No fever, shortness of breath, abdominal pain, nausea, vomiting, diarrhea, constipation.  No rash, bruising, redness.  No history of cardiac or pulmonary disease.  Former smoker.  Her medical history includes hypothyroidism.  Patient was seen at [facility] urgent care on 12/25/2023 for upper respiratory infection; treated with Augmentin and Promethazine DM.  The history is provided by the patient and medical records.    Past Medical History:  Diagnosis Date   Hypothyroidism 2019   Noted during routine labs   Previous cesarean section 01/15/2022   Supervision of high risk pregnancy in third trimester 08/21/2021   Formatting of this note might be different from the original.  33 y.o. G3P1001 at [redacted]w[redacted]d Patient's last menstrual period was 06/13/2021 (exact date). consistent with  with ultrasound @ 9.6 weeks .Estimated Date of Delivery: 03/20/21.  Sex of baby and name:  Boy " "   FOB:         Factors complicating this pregnancy   1. Previous C/S x_1, 11/2019_    C/s done for:  Breech presentation   [x]  Request    Patient Active Problem List   Diagnosis Date Noted   Acute upper respiratory infection 12/25/2023   Multiple benign nevi 07/29/2023   Healthcare maintenance 07/15/2023   Hypothyroidism 06/09/2018    Past Surgical History:  Procedure Laterality Date   CESAREAN SECTION N/A 11/03/2018   Procedure: CESAREAN SECTION;  Surgeon: Ward, Elenora Fender, MD;  Location: [facility] ORS;  Service:  Obstetrics;  Laterality: N/A;   CESAREAN SECTION  03/26/2022   Procedure: CESAREAN SECTION;  Surgeon: Christeen Douglas, MD;  Location: [facility] ORS;  Service: Obstetrics;;   DILATION AND CURETTAGE OF UTERUS N/A 05/07/2021   Procedure: SUCTION D&C;  Surgeon: Christeen Douglas, MD;  Location: [facility] ORS;  Service: Gynecology;  Laterality: N/A;   EYE SURGERY Bilateral 2012   laser eye   FRACTURE SURGERY Right 2002   wrist - no hardware   WISDOM TOOTH EXTRACTION Bilateral 2010    OB History     Gravida  2   Para  2   Term  2   Preterm      AB      Living  2      SAB      IAB      Ectopic      Multiple  0   Live Births  2            Home Medications    Prior to Admission medications   Medication Sig Start Date End Date Taking? Authorizing Provider  amoxicillin-clavulanate (AUGMENTIN) 875-125 MG tablet Take 1 tablet by mouth every 12 (twelve) hours. 12/25/23  Yes Defelice, Para March, NP  levothyroxine (SYNTHROID) 100 MCG tablet Take 1 tablet (100 mcg total) by mouth daily before breakfast. 10/27/23  Yes Jerrol Banana, MD  methocarbamol (ROBAXIN) 500 MG tablet Take 1 tablet (500 mg total)  by mouth 2 (two) times daily as needed for muscle spasms. 01/15/24  Yes Mickie Bail, NP  Norgestimate-Ethinyl Estradiol Triphasic 0.18/0.215/0.25 MG-35 MCG tablet Take 1 tablet by mouth daily. 10/07/23  Yes   promethazine-dextromethorphan (PROMETHAZINE-DM) 6.25-15 MG/5ML syrup Take 5 mLs by mouth 4 (four) times daily as needed for cough. 12/25/23   Defelice, Para March, NP    Family History Family History  Problem Relation Age of Onset   Healthy Mother    Lymphoma Father 46   Melanoma Father        excised   Heart failure Father 19       Passed age 69   Valvular heart disease Father        replacement   Thyroid disease Paternal Grandmother     Social History Social History   Tobacco Use   Smoking status: Former   Smokeless tobacco: Never  Advertising account planner   Vaping status: Never  Used  Substance Use Topics   Alcohol use: Yes    Comment: social   Drug use: Never     Allergies   Patient has no known allergies.   Review of Systems Review of Systems  Constitutional:  Negative for chills and fever.  HENT:  Negative for ear pain and sore throat.   Respiratory:  Positive for cough. Negative for shortness of breath.   Cardiovascular:  Positive for chest pain. Negative for palpitations.  Gastrointestinal:  Negative for abdominal pain, blood in stool, constipation, diarrhea, nausea and vomiting.  Musculoskeletal:  Positive for back pain. Negative for arthralgias and joint swelling.  Skin:  Negative for color change, rash and wound.  Neurological:  Negative for weakness and numbness.     Physical Exam Triage Vital Signs ED Triage Vitals  Encounter Vitals Group     BP      Systolic BP Percentile      Diastolic BP Percentile      Pulse      Resp      Temp      Temp src      SpO2      Weight      Height      Head Circumference      Peak Flow      Pain Score      Pain Loc      Pain Education      Exclude from Growth Chart    No data found.  Updated Vital Signs BP 108/72   Pulse (!) 58   Temp 98.9 F (37.2 C)   Resp 18   LMP 12/30/2023 (Approximate)   SpO2 98%   Breastfeeding No   Visual Acuity Right Eye Distance:   Left Eye Distance:   Bilateral Distance:    Right Eye Near:   Left Eye Near:    Bilateral Near:     Physical Exam Constitutional:      General: She is not in acute distress. HENT:     Mouth/Throat:     Mouth: Mucous membranes are moist.  Cardiovascular:     Rate and Rhythm: Normal rate and regular rhythm.     Heart sounds: Normal heart sounds.  Pulmonary:     Effort: Pulmonary effort is normal. No respiratory distress.     Breath sounds: Normal breath sounds.     Comments: Chest pain is reproducible with palpation of her right upper chest under her breast.  The pain is also reproducible with movement and position  change. Chest:  Chest wall: Tenderness present.  Abdominal:     General: Bowel sounds are normal.     Palpations: Abdomen is soft.     Tenderness: There is no abdominal tenderness. There is no guarding or rebound.  Skin:    General: Skin is warm and dry.     Findings: No bruising, erythema, lesion or rash.  Neurological:     Mental Status: She is alert.      UC Treatments / Results  Labs (all labs ordered are listed, but only abnormal results are displayed) Labs Reviewed - No data to display  EKG   Radiology DG Chest 2 View Result Date: 01/15/2024 CLINICAL DATA:  Cough for 3 weeks. Chest and right upper quadrant abdominal pain. EXAM: CHEST - 2 VIEW COMPARISON:  None Available. FINDINGS: The heart size and mediastinal contours are normal. The lungs are clear. There is no pleural effusion or pneumothorax. No acute osseous findings are identified. Probable lumbar scoliosis. IMPRESSION: No evidence of acute cardiopulmonary process. Probable lumbar scoliosis. Electronically Signed   By: Carey Bullocks M.D.   On: 01/15/2024 11:22    Procedures Procedures (including critical care time)  Medications Ordered in UC Medications - No data to display  Initial Impression / Assessment and Plan / UC Course  I have reviewed the triage vital signs and the nursing notes.  Pertinent labs & imaging results that were available during my care of the patient were reviewed by me and considered in my medical decision making (see chart for details).   Chest pain, cough.  Patient declines transfer to the ED.  Her chest pain is reproducible with palpation and movement. EKG shows sinus bradycardia, rate 56, no ST elevation, no previous to compare.  CXR negative.  ED precaution discussed.  Instructed patient to follow up with her PCP.  Treating with methocarbamol; precautions for drowsiness with this medication discussed.  Continue ibuprofen as directed.   Education provided on chest pain, cough,  costochondritis.  Patient agrees to plan of care.  Final Clinical Impressions(s) / UC Diagnoses   Final diagnoses:  Chest pain, unspecified type  Cough, unspecified type  Chest wall pain  Costochondritis     Discharge Instructions      Follow-up with your primary care provider.  Go to the emergency department if you have persistent or worsening symptoms.    Take the muscle relaxer as directed.  Do not drive, operate machinery, drink alcohol, or perform dangerous activities while taking this medication as it may cause drowsiness.  Continue ibuprofen as directed.         ED Prescriptions     Medication Sig Dispense Auth. Provider   methocarbamol (ROBAXIN) 500 MG tablet Take 1 tablet (500 mg total) by mouth 2 (two) times daily as needed for muscle spasms. 10 tablet Mickie Bail, NP      PDMP not reviewed this encounter.   Mickie Bail, NP 01/15/24 1130

## 2024-01-15 NOTE — ED Triage Notes (Addendum)
Pt states she had a cough x 3 weeks ago and then the pain in her RUQ started about 2 weeks ago. Pt state she thought she pulled a muscle from coughing. Pt states she is having a stabbing pain in her upper right back the goes to under breast on right side. Pt says it gets worse with movement and coughing. Taking ibuprofen and does help with the pain.

## 2024-01-21 ENCOUNTER — Other Ambulatory Visit: Payer: Self-pay

## 2024-01-21 DIAGNOSIS — D2261 Melanocytic nevi of right upper limb, including shoulder: Secondary | ICD-10-CM | POA: Diagnosis not present

## 2024-01-21 DIAGNOSIS — L821 Other seborrheic keratosis: Secondary | ICD-10-CM | POA: Diagnosis not present

## 2024-01-21 DIAGNOSIS — L578 Other skin changes due to chronic exposure to nonionizing radiation: Secondary | ICD-10-CM | POA: Diagnosis not present

## 2024-01-21 DIAGNOSIS — D2262 Melanocytic nevi of left upper limb, including shoulder: Secondary | ICD-10-CM | POA: Diagnosis not present

## 2024-01-21 DIAGNOSIS — D225 Melanocytic nevi of trunk: Secondary | ICD-10-CM | POA: Diagnosis not present

## 2024-01-21 DIAGNOSIS — D2272 Melanocytic nevi of left lower limb, including hip: Secondary | ICD-10-CM | POA: Diagnosis not present

## 2024-01-21 DIAGNOSIS — D2271 Melanocytic nevi of right lower limb, including hip: Secondary | ICD-10-CM | POA: Diagnosis not present

## 2024-01-21 DIAGNOSIS — Z872 Personal history of diseases of the skin and subcutaneous tissue: Secondary | ICD-10-CM | POA: Diagnosis not present

## 2024-01-21 DIAGNOSIS — L718 Other rosacea: Secondary | ICD-10-CM | POA: Diagnosis not present

## 2024-01-21 DIAGNOSIS — L309 Dermatitis, unspecified: Secondary | ICD-10-CM | POA: Diagnosis not present

## 2024-01-21 MED ORDER — TRIAMCINOLONE ACETONIDE 0.1 % EX CREA
TOPICAL_CREAM | CUTANEOUS | 2 refills | Status: AC
  Filled 2024-01-21: qty 80, 30d supply, fill #0

## 2024-01-26 ENCOUNTER — Other Ambulatory Visit: Payer: Self-pay

## 2024-04-23 MED FILL — Levothyroxine Sodium Tab 100 MCG: ORAL | 90 days supply | Qty: 90 | Fill #2 | Status: AC

## 2024-07-25 MED FILL — Levothyroxine Sodium Tab 100 MCG: ORAL | 90 days supply | Qty: 90 | Fill #3 | Status: AC

## 2024-08-03 ENCOUNTER — Encounter: Payer: Self-pay | Admitting: Family Medicine

## 2024-08-03 ENCOUNTER — Ambulatory Visit (INDEPENDENT_AMBULATORY_CARE_PROVIDER_SITE_OTHER): Payer: Self-pay | Admitting: Family Medicine

## 2024-08-03 VITALS — BP 98/64 | HR 60 | Ht 63.0 in | Wt 142.8 lb

## 2024-08-03 DIAGNOSIS — Z0000 Encounter for general adult medical examination without abnormal findings: Secondary | ICD-10-CM

## 2024-08-03 DIAGNOSIS — R5383 Other fatigue: Secondary | ICD-10-CM | POA: Diagnosis not present

## 2024-08-03 DIAGNOSIS — D558 Other anemias due to enzyme disorders: Secondary | ICD-10-CM | POA: Diagnosis not present

## 2024-08-03 DIAGNOSIS — D229 Melanocytic nevi, unspecified: Secondary | ICD-10-CM | POA: Diagnosis not present

## 2024-08-03 DIAGNOSIS — E039 Hypothyroidism, unspecified: Secondary | ICD-10-CM | POA: Diagnosis not present

## 2024-08-03 DIAGNOSIS — F3281 Premenstrual dysphoric disorder: Secondary | ICD-10-CM | POA: Diagnosis not present

## 2024-08-03 NOTE — Assessment & Plan Note (Signed)
 Menstrual cycle-related mood symptoms and contraceptive management - Currently using birth control prescribed by Dr. Verdon - Desires to discontinue birth control - Recalls improved mood, energy, and libido when not on birth control in the past - Experiences increased anxiety and irritability around the menstrual cycle - Considering whether discontinuing birth control may alleviate these symptoms  Premenstrual mood changes Increased anxiety and irritability around menstrual cycles. Prefers non-pharmacological approaches. Discussed hormonal birth control impact on mood and potential discontinuation. - Discuss birth control options with GYN, including potential discontinuation or adjustment. - Consider non-hormonal birth control options. - Encourage regular exercise and mindfulness practices. - Send information on mindfulness and behavioral therapy through MyChart.

## 2024-08-03 NOTE — Assessment & Plan Note (Signed)
 Annual examination completed, risk stratification labs ordered, anticipatory guidance provided.  We will follow labs once resulted.

## 2024-08-03 NOTE — Patient Instructions (Signed)
 - Obtain fasting labs with orders provided (can have water or black coffee but otherwise no food or drink x 8 hours before labs) - Review information provided - Attend eye doctor annually, dentist every 6 months, work towards or maintain 30 minutes of moderate intensity physical activity at least 5 days per week, and consume a balanced diet - Return in 1 year for physical - Contact us  for any questions between now and then   Patient Plan for Post-Visit Guidance  Hypothyroidism - Continue taking Synthroid  as prescribed. - Thyroid  panel (TSH, free T3, free T4) ordered. - Refill Synthroid  prescription after lab results as needed.  Dermatologic Surveillance - Last skin check was normal. Next dermatology appointment is scheduled for next year.  Fatigue and Sleep - Monitor sleep patterns and aim for more rest if possible. - Consider trazodone for sleep if lifestyle changes are not enough.  Premenstrual Mood Changes - Discuss birth control options with your gynecologist, including possible discontinuation or switching to non-hormonal options. - Encourage regular exercise and mindfulness practices. - Review information on mindfulness and behavioral therapy sent through MyChart.  Red Flags - If you experience severe fatigue, unexplained weight changes, new or changing skin lesions, severe mood changes, or any new or worsening symptoms, seek medical attention promptly.  Sleep hygiene advice  When possible, maximize regularity in activity and sleep schedule   Regularity in the timing of sleep, food intake, and social activity helps to stabilize the biological clock.Minimizing discrepancies in sleep timing between on-shift and off-shift periods may help you adapt to a fixed-shift schedule and may also help you adapt to each shift type in a rotating-shift schedule (depending onrotation speed and direction).   Create a sleep-friendly bedroom environment   Make sure that your bed is comfortable  and that your bedroom is dark, quiet, and cool (around 39F or 18C).Blackout shades may be particularly important to block sunlight during daytime sleep. Creating constantbackground noise in the sleep environment with a fan or humidifier, for example, will eliminate unexpectedsounds that would otherwise wake you up.   Limit exposure to bright light before daytime sleep   Exposure to bright light (eg, sunlight during the morning commute home following a night shift) can bealerting and may also set your biological clock to a time that interferes with daytime sleep.   Make the last hour before bed a wind-down time   Engage in relaxing and pleasant activities, dim or block light in the room, and have a light snack.   Do not use alcohol to help you sleep and do not consume alcohol too close to bedtime   Although alcohol may help you to fall asleep more easily, it disrupts your sleep during the night by causingfrequent awakenings. One drink of alcohol should not be consumed within three hours of bedtime.   Smoking and other drugs will disrupt your sleep   If you smoke, do not smoke too close to bedtime or if you wake up during the intended sleep period. Mostdrugs of abuse can disrupt sleep.   Avoid caffeinated products within six hours of bedtime   In addition to coffee, these may include tea, chocolate, and many sodas.   Exercise regularly, but avoid activities that raise body temperature close to bedtime   Regular exercise can improve sleep quality, but exercising or having a warm bath too close to bedtime candisrupt your ability to fall asleep. Warm baths should be avoided within 1.5 hours of bedtime.   Avoid consuming more than 8  to 10 ounces of liquids close to bedtime   A full or semi-full bladder can contribute to awakenings. Restrict liquids close to bedtime and empty yourbladder just before going to bed.

## 2024-08-03 NOTE — Assessment & Plan Note (Signed)
 Thyroid  management - Currently taking Synthroid  - Recently refilled Synthroid  prescription, with a three-month supply remaining and no refills left after that  Hypothyroidism Well-managed with current Synthroid  prescription. Energy levels slightly low due to lifestyle factors. - Order thyroid  panel including TSH, free T3, and free T4. - Continue current Synthroid  dosage. - Refill Synthroid  prescription after lab results as necessary.

## 2024-08-03 NOTE — Progress Notes (Signed)
 Annual Physical Exam Visit  Patient Information:  Patient ID: Alyssa Moore, female DOB: [birth year] Age: 33 y.o. MRN: [id number]   Subjective:   CC: Annual Physical Exam  HPI:  Alyssa Moore is here for their annual physical.  I reviewed the past medical history, family history, social history, surgical history, and allergies today and changes were made as necessary.  Please see the problem list section below for additional details.  Past Medical History: Past Medical History:  Diagnosis Date   Hypothyroidism 2019   Noted during routine labs   Previous cesarean section 01/15/2022   Supervision of high risk pregnancy in third trimester 08/21/2021   Formatting of this note might be different from the original.  33 y.o. G3P1001 at [redacted]w[redacted]d Patient's last menstrual period was 06/13/2021 (exact date). consistent with  with ultrasound @ 9.6 weeks .Estimated Date of Delivery: 03/20/21.  Sex of baby and name:  Boy     FOB:         Factors complicating this pregnancy   1. Previous C/S x_1, 11/2019_    C/s done for:  Breech presentation   [x]  Request   Past Surgical History: Past Surgical History:  Procedure Laterality Date   CESAREAN SECTION N/A 11/03/2018   Procedure: CESAREAN SECTION;  Surgeon: Ward, Mitzie BROCKS, MD;  Location: [facility] ORS;  Service: Obstetrics;  Laterality: N/A;   CESAREAN SECTION  03/26/2022   Procedure: CESAREAN SECTION;  Surgeon: Verdon Keen, MD;  Location: [facility] ORS;  Service: Obstetrics;;   DILATION AND CURETTAGE OF UTERUS N/A 05/07/2021   Procedure: SUCTION D&C;  Surgeon: Verdon Keen, MD;  Location: [facility] ORS;  Service: Gynecology;  Laterality: N/A;   EYE SURGERY Bilateral 2012   laser eye   FRACTURE SURGERY Right 2002   wrist - no hardware   WISDOM TOOTH EXTRACTION Bilateral 2010   Family History: Family History  Problem Relation Age of Onset   Healthy Mother    Lymphoma Father 18   Melanoma Father        excised   Heart failure Father 73        Passed age 77   Valvular heart disease Father        replacement   Thyroid  disease Paternal Grandmother    Allergies: No Known Allergies Health Maintenance: Health Maintenance  Topic Date Due   Cervical Cancer Screening (HPV/Pap Cotest)  Never done   INFLUENZA VACCINE  03/15/2025 (Originally 07/16/2024)   Hepatitis B Vaccines 19-59 Average Risk (1 of 3 - 19+ 3-dose series) 08/03/2025 (Originally 05/04/2008)   HPV VACCINES (1 - Risk 3-dose SCDM series) 08/03/2025 (Originally 05/04/2016)   DTaP/Tdap/Td (2 - Td or Tdap) 09/22/2028   Hepatitis C Screening  Completed   HIV Screening  Completed   Pneumococcal Vaccine  Aged Out   Meningococcal B Vaccine  Aged Out   COVID-19 Vaccine  Discontinued    HM Colonoscopy   This patient has no relevant Health Maintenance data.    Medications: Current Outpatient Medications on File Prior to Visit  Medication Sig Dispense Refill   levothyroxine  (SYNTHROID ) 100 MCG tablet Take 1 tablet (100 mcg total) by mouth daily before breakfast. 90 tablet 3   Norgestimate -Ethinyl Estradiol Triphasic 0.18/0.215/0.25 MG-35 MCG tablet Take 1 tablet by mouth daily. 84 tablet 3   triamcinolone  cream (KENALOG ) 0.1 % Apply thin layer to affected areas on arms twice daily as needed for flares. Do not apply on clear skin (Patient not taking: Reported on 08/03/2024)  80 g 2   No current facility-administered medications on file prior to visit.    Objective:   Vitals:   08/03/24 0803  BP: 98/64  Pulse: 60  SpO2: 99%   Vitals:   08/03/24 0803  Weight: 142 lb 12.8 oz (64.8 kg)  Height: 5' 3 (1.6 m)   Body mass index is 25.3 kg/m.  General: Well Developed, well nourished, and in no acute distress.  Neuro: Alert and oriented x3, extra-ocular muscles intact, sensation grossly intact. Cranial nerves II through XII are grossly intact, motor, sensory, and coordinative functions are intact. HEENT: Normocephalic, atraumatic, neck supple, no masses, no  lymphadenopathy, thyroid  nonenlarged. Oropharynx, nasopharynx, external ear canals are unremarkable. Skin: Warm and dry, no rashes noted.  Cardiac: Regular rate and rhythm, no murmurs rubs or gallops. No peripheral edema. Pulses symmetric. Respiratory: Clear to auscultation bilaterally. Speaking in full sentences.  Abdominal: Soft, nontender, nondistended, positive bowel sounds, no masses, no organomegaly. Musculoskeletal: Stable, and with full range of motion.  Impression and Recommendations:   The patient was counselled, risk factors were discussed, and anticipatory guidance given.  Problem List Items Addressed This Visit     Healthcare maintenance - Primary   Annual examination completed, risk stratification labs ordered, anticipatory guidance provided.  We will follow labs once resulted.      Relevant Orders   CBC   Comprehensive metabolic panel with GFR   Hemoglobin A1c   Lipid panel   Hypothyroidism   Thyroid  management - Currently taking Synthroid  - Recently refilled Synthroid  prescription, with a three-month supply remaining and no refills left after that  Hypothyroidism Well-managed with current Synthroid  prescription. Energy levels slightly low due to lifestyle factors. - Order thyroid  panel including TSH, free T3, and free T4. - Continue current Synthroid  dosage. - Refill Synthroid  prescription after lab results as necessary.      Relevant Orders   T3, free   T4, free   TSH   Multiple benign nevi   Dermatologic surveillance - Underwent a skin check in February with Dr. Andrea Clause at [facility] - Skin check results were normal - Follow-up dermatology appointment scheduled for next year      Other fatigue   Fatigue and sleep patterns - Endorses lack of energy, attributed to busy lifestyle with work and caring for two young children - Averages approximately six hours of sleep per night - Falls asleep easily and does not wake up frequently during  the night  Sleep disturbances Sleep disturbances related to lifestyle factors. Prefers lifestyle adjustments over medication. Trazodone discussed as a non-habit forming option. - Monitor sleep patterns and consider trazodone if lifestyle adjustments are insufficient.      Pre-menstrual mood disorder   Menstrual cycle-related mood symptoms and contraceptive management - Currently using birth control prescribed by Dr. Verdon - Desires to discontinue birth control - Recalls improved mood, energy, and libido when not on birth control in the past - Experiences increased anxiety and irritability around the menstrual cycle - Considering whether discontinuing birth control may alleviate these symptoms  Premenstrual mood changes Increased anxiety and irritability around menstrual cycles. Prefers non-pharmacological approaches. Discussed hormonal birth control impact on mood and potential discontinuation. - Discuss birth control options with GYN, including potential discontinuation or adjustment. - Consider non-hormonal birth control options. - Encourage regular exercise and mindfulness practices. - Send information on mindfulness and behavioral therapy through MyChart.        Orders & Medications Medications: No orders of the defined types  were placed in this encounter.  Orders Placed This Encounter  Procedures   CBC   Comprehensive metabolic panel with GFR   Hemoglobin A1c   Lipid panel   T3, free   T4, free   TSH     No follow-ups on file.    Selinda JINNY Ku, MD, [facility]   Primary Care Sports Medicine Primary Care and Sports Medicine at [facility]

## 2024-08-03 NOTE — Assessment & Plan Note (Signed)
 Dermatologic surveillance - Underwent a skin check in February with Dr. Andrea Clause at [facility] - Skin check results were normal - Follow-up dermatology appointment scheduled for next year

## 2024-08-03 NOTE — Assessment & Plan Note (Signed)
 Fatigue and sleep patterns - Endorses lack of energy, attributed to busy lifestyle with work and caring for two young children - Averages approximately six hours of sleep per night - Falls asleep easily and does not wake up frequently during the night  Sleep disturbances Sleep disturbances related to lifestyle factors. Prefers lifestyle adjustments over medication. Trazodone discussed as a non-habit forming option. - Monitor sleep patterns and consider trazodone if lifestyle adjustments are insufficient.

## 2024-08-04 LAB — COMPREHENSIVE METABOLIC PANEL WITH GFR
ALT: 17 IU/L (ref 0–32)
AST: 18 IU/L (ref 0–40)
Albumin: 4.4 g/dL (ref 3.9–4.9)
Alkaline Phosphatase: 43 IU/L — ABNORMAL LOW (ref 44–121)
BUN/Creatinine Ratio: 17 (ref 9–23)
BUN: 12 mg/dL (ref 6–20)
Bilirubin Total: 0.6 mg/dL (ref 0.0–1.2)
CO2: 22 mmol/L (ref 20–29)
Calcium: 9.4 mg/dL (ref 8.7–10.2)
Chloride: 103 mmol/L (ref 96–106)
Creatinine, Ser: 0.72 mg/dL (ref 0.57–1.00)
Globulin, Total: 2.8 g/dL (ref 1.5–4.5)
Glucose: 85 mg/dL (ref 70–99)
Potassium: 4.6 mmol/L (ref 3.5–5.2)
Sodium: 139 mmol/L (ref 134–144)
Total Protein: 7.2 g/dL (ref 6.0–8.5)
eGFR: 112 mL/min/1.73 (ref >59)

## 2024-08-04 LAB — LIPID PANEL
Chol/HDL Ratio: 2.7 ratio (ref 0.0–4.4)
Cholesterol, Total: 191 mg/dL (ref 100–199)
HDL: 71 mg/dL (ref >39)
LDL Chol Calc (NIH): 96 mg/dL (ref 0–99)
Triglycerides: 140 mg/dL (ref 0–149)
VLDL Cholesterol Cal: 24 mg/dL (ref 5–40)

## 2024-08-04 LAB — HEMOGLOBIN A1C
Est. average glucose Bld gHb Est-mCnc: 94 mg/dL
Hgb A1c MFr Bld: 4.9 % (ref 4.8–5.6)

## 2024-08-04 LAB — CBC
Hematocrit: 36.6 % (ref 34.0–46.6)
Hemoglobin: 12.7 g/dL (ref 11.1–15.9)
MCH: 34.3 pg — ABNORMAL HIGH (ref 26.6–33.0)
MCHC: 34.7 g/dL (ref 31.5–35.7)
MCV: 99 fL — ABNORMAL HIGH (ref 79–97)
Platelets: 287 x10E3/uL (ref 150–450)
RBC: 3.7 x10E6/uL — ABNORMAL LOW (ref 3.77–5.28)
RDW: 11.8 % (ref 11.7–15.4)
WBC: 5.8 x10E3/uL (ref 3.4–10.8)

## 2024-08-05 LAB — T3, FREE: T3, Free: 2.7 pg/mL (ref 2.0–4.4)

## 2024-08-05 LAB — TSH: TSH: 4.05 u[IU]/mL (ref 0.450–4.500)

## 2024-08-05 LAB — SPECIMEN STATUS REPORT

## 2024-08-05 LAB — T4, FREE: Free T4: 1.42 ng/dL (ref 0.82–1.77)

## 2024-08-09 ENCOUNTER — Ambulatory Visit: Payer: Self-pay | Admitting: Family Medicine

## 2024-08-09 ENCOUNTER — Other Ambulatory Visit: Payer: Self-pay

## 2024-08-09 DIAGNOSIS — E039 Hypothyroidism, unspecified: Secondary | ICD-10-CM

## 2024-08-09 MED ORDER — LEVOTHYROXINE SODIUM 100 MCG PO TABS
100.0000 ug | ORAL_TABLET | Freq: Every day | ORAL | 3 refills | Status: AC
  Filled 2024-08-09 – 2024-10-25 (×2): qty 90, 90d supply, fill #0

## 2024-08-12 LAB — ANEMIA PROFILE A
Iron Saturation: 42 % (ref 15–55)
Iron: 174 ug/dL — ABNORMAL HIGH (ref 27–159)
Total Iron Binding Capacity: 416 ug/dL (ref 250–450)
UIBC: 242 ug/dL (ref 131–425)

## 2024-08-12 LAB — SPECIMEN STATUS REPORT

## 2024-08-12 LAB — VITAMIN B12: Vitamin B-12: 248 pg/mL (ref 232–1245)

## 2024-08-12 LAB — FERRITIN: Ferritin: 68 ng/mL (ref 15–150)

## 2024-08-23 ENCOUNTER — Other Ambulatory Visit: Payer: Self-pay

## 2024-08-23 MED ORDER — NORGESTIM-ETH ESTRAD TRIPHASIC 0.18/0.215/0.25 MG-35 MCG PO TABS
1.0000 | ORAL_TABLET | Freq: Every day | ORAL | 3 refills | Status: AC
  Filled 2024-08-23: qty 84, 84d supply, fill #0
  Filled 2024-11-17: qty 84, 84d supply, fill #1

## 2024-10-25 ENCOUNTER — Other Ambulatory Visit: Payer: Self-pay

## 2025-08-09 ENCOUNTER — Encounter: Admitting: Family Medicine
